# Patient Record
Sex: Male | Born: 1975 | Race: Black or African American | Hispanic: No | Marital: Married | Smoking: Never smoker
Health system: Southern US, Community
[De-identification: ages and names within clinical notes are randomized; demographics above are authoritative.]

## PROBLEM LIST (undated history)

## (undated) DIAGNOSIS — Z973 Presence of spectacles and contact lenses: Secondary | ICD-10-CM

## (undated) DIAGNOSIS — B019 Varicella without complication: Secondary | ICD-10-CM

## (undated) DIAGNOSIS — A879 Viral meningitis, unspecified: Secondary | ICD-10-CM

## (undated) DIAGNOSIS — L309 Dermatitis, unspecified: Secondary | ICD-10-CM

## (undated) DIAGNOSIS — K219 Gastro-esophageal reflux disease without esophagitis: Secondary | ICD-10-CM

## (undated) DIAGNOSIS — T560X1A Toxic effect of lead and its compounds, accidental (unintentional), initial encounter: Secondary | ICD-10-CM

## (undated) DIAGNOSIS — N201 Calculus of ureter: Secondary | ICD-10-CM

## (undated) DIAGNOSIS — J302 Other seasonal allergic rhinitis: Secondary | ICD-10-CM

## (undated) HISTORY — DX: Gastro-esophageal reflux disease without esophagitis: K21.9

## (undated) HISTORY — DX: Dermatitis, unspecified: L30.9

## (undated) HISTORY — DX: Toxic effect of lead and its compounds, accidental (unintentional), initial encounter: T56.0X1A

## (undated) HISTORY — PX: WISDOM TOOTH EXTRACTION: SHX21

## (undated) HISTORY — DX: Viral meningitis, unspecified: A87.9

## (undated) HISTORY — DX: Varicella without complication: B01.9

---
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
## 2001-07-25 ENCOUNTER — Emergency Department (HOSPITAL_COMMUNITY): Admission: EM | Admit: 2001-07-25 | Discharge: 2001-07-25 | Payer: Self-pay | Admitting: Emergency Medicine

## 2003-08-10 ENCOUNTER — Emergency Department (HOSPITAL_COMMUNITY): Admission: EM | Admit: 2003-08-10 | Discharge: 2003-08-10 | Payer: Self-pay | Admitting: Emergency Medicine

## 2003-08-10 ENCOUNTER — Encounter: Payer: Self-pay | Admitting: Emergency Medicine

## 2004-07-01 ENCOUNTER — Emergency Department (HOSPITAL_COMMUNITY): Admission: EM | Admit: 2004-07-01 | Discharge: 2004-07-01 | Payer: Self-pay | Admitting: Emergency Medicine

## 2004-07-14 ENCOUNTER — Emergency Department (HOSPITAL_COMMUNITY): Admission: EM | Admit: 2004-07-14 | Discharge: 2004-07-14 | Payer: Self-pay | Admitting: Family Medicine

## 2011-11-15 HISTORY — PX: ESOPHAGOGASTRODUODENOSCOPY: SHX1529

## 2012-04-15 ENCOUNTER — Ambulatory Visit: Payer: BLUE CROSS/BLUE SHIELD

## 2012-04-15 ENCOUNTER — Ambulatory Visit (INDEPENDENT_AMBULATORY_CARE_PROVIDER_SITE_OTHER): Payer: BLUE CROSS/BLUE SHIELD | Admitting: Emergency Medicine

## 2012-04-15 VITALS — BP 136/72 | HR 58 | Temp 98.3°F | Resp 16 | Ht 71.5 in | Wt 149.0 lb

## 2012-04-15 DIAGNOSIS — M79609 Pain in unspecified limb: Secondary | ICD-10-CM

## 2012-04-15 DIAGNOSIS — M79676 Pain in unspecified toe(s): Secondary | ICD-10-CM

## 2012-04-15 DIAGNOSIS — S90129A Contusion of unspecified lesser toe(s) without damage to nail, initial encounter: Secondary | ICD-10-CM

## 2012-04-15 NOTE — Progress Notes (Signed)
  Subjective:    Patient ID: Willie Farmer, male    DOB: 1975/11/16, 36 y.o.   MRN: [id number]  HPI patient was in good health until yesterday when he struck his right fifth toe on this out of the bathroom wall. He is here with pain and swelling in the toe.    Review of Systems     Objective:   Physical Exam there is tenderness and swelling over the right fifth toe. There is decreased range of motion with movement of the toe to  [facility] reading (PRIMARY) by  Dr.Myishia Kasik there is no definite fracture seen. There may be a crack in the fused middle and distal phalanx of the fifth toe but I don't think there is..        Assessment & Plan:     Assessment is contusion toe. Treatment is buddy taping.

## 2012-04-17 ENCOUNTER — Encounter: Payer: Self-pay | Admitting: *Deleted

## 2012-04-17 ENCOUNTER — Telehealth: Payer: Self-pay

## 2012-04-17 NOTE — Telephone Encounter (Signed)
PT PICKED UP COPY OF NOTE

## 2012-04-17 NOTE — Telephone Encounter (Signed)
Pt wants a note for work- putting him on light duty.  Goes in today at 3 pm.  Has broken toe.  Call [phone]  Pt would like to pick up the note and have it faxed to [phone]

## 2012-05-22 ENCOUNTER — Ambulatory Visit (INDEPENDENT_AMBULATORY_CARE_PROVIDER_SITE_OTHER): Payer: BLUE CROSS/BLUE SHIELD

## 2012-05-23 ENCOUNTER — Ambulatory Visit (INDEPENDENT_AMBULATORY_CARE_PROVIDER_SITE_OTHER): Payer: BLUE CROSS/BLUE SHIELD | Admitting: Family Medicine

## 2012-05-23 VITALS — BP 134/76 | HR 59 | Temp 97.9°F | Resp 18 | Ht 72.0 in | Wt 148.0 lb

## 2012-05-23 DIAGNOSIS — M25519 Pain in unspecified shoulder: Secondary | ICD-10-CM

## 2012-05-23 MED ORDER — METHYLPREDNISOLONE 4 MG PO KIT: PACK | ORAL | Status: AC
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
## 2012-06-12 ENCOUNTER — Telehealth: Payer: Self-pay

## 2012-06-12 DIAGNOSIS — M25519 Pain in unspecified shoulder: Secondary | ICD-10-CM

## 2012-06-12 MED ORDER — MELOXICAM 7.5 MG PO TABS: 7.5000 mg | ORAL_TABLET | Freq: Two times a day (BID) | ORAL | Status: DC

## 2012-06-12 NOTE — Telephone Encounter (Signed)
Pt wanted a another round of the cream that was given to him when he was seen, he states it was not a rx but Dr gave him some samples. Please call pt to let him know if this is possible.
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
## 2012-06-12 NOTE — Telephone Encounter (Signed)
I have no cream on file for him need to know what it is. Have called him and left mssg for call back.

## 2012-07-30 ENCOUNTER — Telehealth: Payer: Self-pay

## 2012-07-30 ENCOUNTER — Ambulatory Visit (INDEPENDENT_AMBULATORY_CARE_PROVIDER_SITE_OTHER): Payer: BLUE CROSS/BLUE SHIELD | Admitting: Family Medicine

## 2012-07-30 ENCOUNTER — Ambulatory Visit: Payer: BLUE CROSS/BLUE SHIELD

## 2012-07-30 VITALS — BP 118/72 | HR 60 | Temp 98.0°F | Resp 16 | Ht 72.0 in | Wt 151.0 lb

## 2012-07-30 DIAGNOSIS — R1013 Epigastric pain: Secondary | ICD-10-CM

## 2012-07-30 DIAGNOSIS — IMO0002 Reserved for concepts with insufficient information to code with codable children: Secondary | ICD-10-CM

## 2012-07-30 DIAGNOSIS — M25519 Pain in unspecified shoulder: Secondary | ICD-10-CM

## 2012-07-30 DIAGNOSIS — K920 Hematemesis: Secondary | ICD-10-CM

## 2012-07-30 LAB — IFOBT (OCCULT BLOOD): IFOBT: NEGATIVE

## 2012-07-30 LAB — POCT CBC
HCT, POC: 44.3 % (ref 43.5–53.7)
MCV: 86.6 fL (ref 80–97)
MPV: 8.3 fL (ref 0–99.8)
POC Granulocyte: 2.5 (ref 2–6.9)
POC LYMPH PERCENT: 33.9 %L (ref 10–50)
POC MID %: 7.9 %M (ref 0–12)
Platelet Count, POC: 243 10*3/uL (ref 142–424)
WBC: 4.3 10*3/uL — AB (ref 4.6–10.2)

## 2012-07-30 MED ORDER — TRAMADOL HCL 50 MG PO TABS: 50.0000 mg | ORAL_TABLET | Freq: Three times a day (TID) | ORAL | Status: DC | PRN

## 2012-07-30 MED ORDER — OMEPRAZOLE 40 MG PO CPDR: 40.0000 mg | DELAYED_RELEASE_CAPSULE | Freq: Every day | ORAL | Status: DC

## 2012-07-30 NOTE — Telephone Encounter (Signed)
Message copied by Chrystie Nose on Mon Jul 30, 2012  2:29 PM ------      Message from: Melvia Heaps D      Created: Mon Jul 30, 2012  2:28 PM      Regarding: New patient       Patient is having black stools and needs an office visit next 24 hours. He is having a minor GI bleed

## 2012-07-30 NOTE — Patient Instructions (Addendum)
I have spoken with [facility] and they will call you within the next day.  It seems that you have an ulcer (stomach erosion) which is bleeding a small amount.    No more mobic or other NSAID medications such as ibuprofen or aleve.  Please take your PPI (acid reducer medication).   If anything changes- if you develop more pain, have rectal bleeding or are throwing up red blood or have more frequent vomiting- please come back or go to the ER right away
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
## 2012-07-30 NOTE — Telephone Encounter (Signed)
Pt scheduled to see Amy Esterwood PA tomorrow at 9am. Pt aware of appt date and time. 

## 2012-07-31 ENCOUNTER — Encounter: Payer: Self-pay | Admitting: Physician Assistant

## 2012-07-31 ENCOUNTER — Ambulatory Visit (INDEPENDENT_AMBULATORY_CARE_PROVIDER_SITE_OTHER): Payer: Self-pay | Admitting: Physician Assistant

## 2012-07-31 VITALS — BP 110/60 | HR 60 | Ht 73.0 in | Wt 153.6 lb

## 2012-07-31 DIAGNOSIS — K920 Hematemesis: Secondary | ICD-10-CM

## 2012-07-31 DIAGNOSIS — R1013 Epigastric pain: Secondary | ICD-10-CM

## 2012-07-31 DIAGNOSIS — L98499 Non-pressure chronic ulcer of skin of other sites with unspecified severity: Secondary | ICD-10-CM

## 2012-07-31 DIAGNOSIS — IMO0002 Reserved for concepts with insufficient information to code with codable children: Secondary | ICD-10-CM

## 2012-07-31 MED ORDER — OMEPRAZOLE 40 MG PO CPDR: 40.0000 mg | DELAYED_RELEASE_CAPSULE | Freq: Every day | ORAL | Status: DC

## 2012-07-31 NOTE — Progress Notes (Signed)
Subjective:    Patient ID: Willie Farmer, male    DOB: 1976/07/22, 36 y.o.   MRN: [id number]  HPI Willie Farmer is a 36 year old male with new to GI today him a referred by urgent care. He was seen there yesterday by Dr. Dallas Schimke with complaints of epigastric pain and 2 episodes of coffee-ground emesis Patient says that his symptoms started on Friday, 07/27/2012 which point he was having fairly constant epigastric discomfort which was keeping him awake at night. Had not had any nausea at that point a tried multiple different over-the-counter medicines all without any relief. He did take several doses of Pepto-Bismol. Patient had been taking of MOBIC 1 or 2 daily over the past month for shoulder pain which was stopped yesterday. He denies any heartburn or indigestion no dysphagia or diet aphasia. He has not had any melena or hematochezia and states that what he vomited up to taste like blood. Stool for occult blood was checked yesterday and was negative. Plain abdominal films were also done and were unremarkable. CBC showed WBC of 4.3 hemoglobin 13.9 and hematocrit of 44.3. He has been started on Prilosec 40 mg once daily and says they are ready feels much better. He still getting tabs of discomfort in his abdomen but the constant pain is subsiding. He has been eating but admits that he is afraid to eat because he feels that eating will either rate his symptoms.  Patient has been generally healthy he is employed as a Psychologist, occupational and has very physical job.  Is currently out of work since yesterday. He does not have any prior GI history. 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 He says he did have some long-term memory loss with this.    Review of Systems  Constitutional: Positive for appetite change.  HENT: Negative.   Eyes: Negative.   Respiratory: Negative.   Cardiovascular: Negative.   Gastrointestinal: Positive for nausea, vomiting  and abdominal pain.  Genitourinary: Negative.   Musculoskeletal: Negative.   Neurological: Negative.   Hematological: Negative.   Psychiatric/Behavioral: Negative.    Outpatient Prescriptions Prior to Visit  Medication Sig Dispense Refill  . traMADol (ULTRAM) 50 MG tablet Take 1 tablet (50 mg total) by mouth every 8 (eight) hours as needed for pain. Be cautious of sedation, use sparingly  30 tablet  0  . omeprazole (PRILOSEC) 40 MG capsule Take 1 capsule (40 mg total) by mouth daily.  30 capsule  3  . meloxicam (MOBIC) 7.5 MG tablet Take 1 tablet (7.5 mg total) by mouth 2 (two) times daily.  60 tablet  5   Allergies  Allergen Reactions  . Nsaids     Intolerance    Active Ambulatory Problems    Diagnosis Date Noted  . No Active Ambulatory Problems   Resolved Ambulatory Problems    Diagnosis Date Noted  . No Resolved Ambulatory Problems   Past Medical History  Diagnosis Date  . Meningitis, viral 1995   History   Social History  . Marital Status: Married    Spouse Name: N/A    Number of Children: N/A  . Years of Education: N/A   Occupational History  . Welder    Social History Main Topics  . Smoking status: Never Smoker   . Smokeless tobacco: Never Used  . Alcohol Use: Yes  . Drug Use: No  . Sexually Active: Not on file   Other Topics Concern  .  Not on file   Social History Narrative  . No narrative on file    Objective:   Physical Exam well-developed thin pleasant African American male in no acute distress, accompanied by his wife. Blood pressure 110/60 pulse 60 height 6 foot 1 weight 153. HEENT; nontraumatic normocephalic EOMI PERRLA sclera anicteric,Neck; Supple no JVD, Cardiovascular; regular rate and rhythm with S1-S2 no murmur or gallop, Pulmonary; clear bilaterally, Abdomen; soft bowel sounds are active he is tender in the epigastrium there is no guarding no rebound no palpable mass or hepatosplenomegaly, Rectal; exam not done fecal occult blood test  yesterday was negative, Extremities; no clubbing, cyanosis, or edema skin warm dry, Psych; mood and affect normal and appropriate        Assessment & Plan:  #12 36 year old male with 4 day history of epigastric pain nausea and self-limited episode of coffee-ground emesis. I suspect he has an NSAID-induced gastropathy or ulcer. He has no evidence for active bleeding, and symptoms are improving  Plan; increase Prilosec to 40 mg by mouth twice daily x2 weeks then once daily thereafter Stop all NSAID use, may use Tylenol as needed for shoulder pain Schedule for upper endoscopy with Dr. Jarold Motto as soon as possible-this has been scheduled for tomorrow a.m. Soft bland diet until epigastric pain subsides Patient is to remain out of work until after the endoscopy then we'll decide on timing of return to work based on findings.

## 2012-07-31 NOTE — Patient Instructions (Addendum)
We scheduled the Endoscopy with Dr. Sheryn Bison. Increase the Prilosec 40 mg to 1 capsule twice daily for 2 weeks then go back to one capsule once daily.  Upper GI Endoscopy Upper GI endoscopy means using a flexible scope to look at the esophagus, stomach, and upper small bowel. This is done to make a diagnosis in people with heartburn, abdominal pain, or abnormal bleeding. Sometimes an endoscope is needed to remove foreign bodies or food that become stuck in the esophagus; it can also be used to take biopsy samples. For the best results, do not eat or drink for 8 hours before having your upper endoscopy.  To perform the endoscopy, you will probably be sedated and your throat will be numbed with a special spray. The endoscope is then slowly passed down your throat (this will not interfere with your breathing). An endoscopy exam takes 15 to 30 minutes to complete and there is no real pain. Patients rarely remember much about the procedure. The results of the test may take several days if a biopsy or other test is taken.  You may have a sore throat after an endoscopy exam. Serious complications are very rare. Stick to liquids and soft foods until your pain is better. Do not drive a car or operate any dangerous equipment for at least 24 hours after being sedated. SEEK IMMEDIATE MEDICAL CARE IF:   You have severe throat pain.   You have shortness of breath.   You have bleeding problems.   You have a fever.   You have difficulty recovering from your sedation.  Document Released: 12/08/2004 Document Revised: 10/20/2011 Document Reviewed: 11/02/2008 [facility] Patient Information 2012 [place], [place].

## 2012-07-31 NOTE — Progress Notes (Signed)
I agree with assessment and plan.

## 2012-08-01 ENCOUNTER — Encounter: Payer: Self-pay | Admitting: Gastroenterology

## 2012-08-01 ENCOUNTER — Ambulatory Visit (AMBULATORY_SURGERY_CENTER): Payer: BLUE CROSS/BLUE SHIELD | Admitting: Gastroenterology

## 2012-08-01 ENCOUNTER — Telehealth: Payer: Self-pay | Admitting: Gastroenterology

## 2012-08-01 ENCOUNTER — Encounter: Payer: Self-pay | Admitting: Family Medicine

## 2012-08-01 VITALS — BP 131/74 | HR 64 | Temp 97.0°F | Resp 18 | Ht 73.0 in | Wt 153.0 lb

## 2012-08-01 DIAGNOSIS — K920 Hematemesis: Secondary | ICD-10-CM

## 2012-08-01 DIAGNOSIS — K2100 Gastro-esophageal reflux disease with esophagitis, without bleeding: Secondary | ICD-10-CM

## 2012-08-01 DIAGNOSIS — Z8719 Personal history of other diseases of the digestive system: Secondary | ICD-10-CM

## 2012-08-01 DIAGNOSIS — R1013 Epigastric pain: Secondary | ICD-10-CM

## 2012-08-01 HISTORY — PX: ESOPHAGOGASTRODUODENOSCOPY: SHX1529

## 2012-08-01 MED ORDER — SODIUM CHLORIDE 0.9 % IV SOLN: 500.0000 mL | INTRAVENOUS | Status: DC

## 2012-08-01 NOTE — Patient Instructions (Addendum)
Per Dr. Jarold Motto avoid NSAIDS for two weeks.  Await biopsy results.  You may resume your other current medications today.  Please call if any questions or concerns.    YOU HAD AN ENDOSCOPIC PROCEDURE TODAY AT THE [facility]: Refer to the procedure report that was given to you for any specific questions about what was found during the examination.  If the procedure report does not answer your questions, please call your gastroenterologist to clarify.  If you requested that your care partner not be given the details of your procedure findings, then the procedure report has been included in a sealed envelope for you to review at your convenience later.  YOU SHOULD EXPECT: Some feelings of bloating in the abdomen. Passage of more gas than usual.  Walking can help get rid of the air that was put into your GI tract during the procedure and reduce the bloating. If you had a lower endoscopy (such as a colonoscopy or flexible sigmoidoscopy) you may notice spotting of blood in your stool or on the toilet paper. If you underwent a bowel prep for your procedure, then you may not have a normal bowel movement for a few days.  DIET: Your first meal following the procedure should be a light meal and then it is ok to progress to your normal diet.  A half-sandwich or bowl of soup is an example of a good first meal.  Heavy or fried foods are harder to digest and may make you feel nauseous or bloated.  Likewise meals heavy in dairy and vegetables can cause extra gas to form and this can also increase the bloating.  Drink plenty of fluids but you should avoid alcoholic beverages for 24 hours.  ACTIVITY: Your care partner should take you home directly after the procedure.  You should plan to take it easy, moving slowly for the rest of the day.  You can resume normal activity the day after the procedure however you should NOT DRIVE or use heavy machinery for 24 hours (because of the sedation medicines used during  the test).    SYMPTOMS TO REPORT IMMEDIATELY: A gastroenterologist can be reached at any hour.  During normal business hours, 8:30 AM to 5:00 PM Monday through Friday, call [phone].  After hours and on weekends, please call the GI answering service at [phone] who will take a message and have the physician on call contact you.     Following upper endoscopy (EGD)  Vomiting of blood or coffee ground material  New chest pain or pain under the shoulder blades  Painful or persistently difficult swallowing  New shortness of breath  Fever of 100F or higher  Black, tarry-looking stools  FOLLOW UP: If any biopsies were taken you will be contacted by phone or by letter within the next 1-3 weeks.  Call your gastroenterologist if you have not heard about the biopsies in 3 weeks.  Our staff will call the home number listed on your records the next business day following your procedure to check on you and address any questions or concerns that you may have at that time regarding the information given to you following your procedure. This is a courtesy call and so if there is no answer at the home number and we have not heard from you through the emergency physician on call, we will assume that you have returned to your regular daily activities without incident.  SIGNATURES/CONFIDENTIALITY: You and/or your care partner have signed paperwork which  will be entered into your electronic medical record.  These signatures attest to the fact that that the information above on your After Visit Summary has been reviewed and is understood.  Full responsibility of the confidentiality of this discharge information lies with you and/or your care-partner.

## 2012-08-01 NOTE — Progress Notes (Signed)
No complaints noted in the recovery room. Maw  Patient did not have preoperative order for IV antibiotic SSI prophylaxis. (G8918) Patient did not experience any of the following events: a burn prior to discharge; a fall within the facility; wrong site/side/patient/procedure/implant event; or a hospital transfer or hospital admission upon discharge from the facility. (G8907)  

## 2012-08-01 NOTE — Op Note (Signed)
[facility] [address] [place], [address]   ENDOSCOPY PROCEDURE REPORT  PATIENT: Willie Farmer, Willie Farmer  MR#: [id number] BIRTHDATE: 1975-12-11 , 35  yrs. old GENDER: Male ENDOSCOPIST:David Hale Bogus, MD, [facility] REFERRED BY: PROCEDURE DATE:  08/01/2012 PROCEDURE:   EGD w/ biopsy and EGD w/ biopsy for H.pylori ASA CLASS:    Class I INDICATIONS: periumbilical abdominal pain, dyspepsia, and hematemesis. MEDICATION: propofol (Diprivan) 250mg  IV TOPICAL ANESTHETIC:   Cetacaine Spray  DESCRIPTION OF PROCEDURE:   After the risks and benefits of the procedure were explained, informed consent was obtained.  The LB [id number]  endoscope was introduced through the mouth  and advanced to the second portion of the duodenum .  The instrument was slowly withdrawn as the mucosa was fully examined.      ANTRAL EROSIONS WITHOUT BLEEDING NOTED...CLO BX.  DONE...SEE PICTURES.  ESOPHAGUS: There was evidence of suspected Barrett's esophagus at the gastroesophageal junction.  Multiple biopsies were performed using cold forceps.  DUODENUM: NORMAL MUCOSA.    Retroflexed views revealed no abnormalities.  The scope was then withdrawn from the patient and the procedure completed.  COMPLICATIONS: There were no complications.   ENDOSCOPIC IMPRESSION: 1.   ANTRAL EROSIONS WITHOUT BLEEDING NOTED...CLO BX.  DONE...SEE PICTURES .Marland KitchenPROBABLE NSAID DAMAGE,NO CURRENT BLEEDING. 2.   There was evidence of suspected Barrett's esophagus; multiple biopsies 3.   NORMAL MUCOSA IN DUODENUM  RECOMMENDATIONS: 1.  Avoid NSAIDS for two weeks 2.  await pathology results 3.  continue PPI  4.  Rx CLO if positive    _______________________________ eSigned:  Mardella Layman, MD, [facility] 08/01/2012 11:27 AM   standard discharge   PATIENT NAME:  [name], [name] MR#: [id number]

## 2012-08-01 NOTE — Telephone Encounter (Signed)
Pt. Wanted to mention that when he eats late at night after his shift that he coughs violently and it feels like is is going to vomit.  He typically eats at [facility]-chopped cheese steak. He has this meal anywhere from 11:30PM- 3PM.  States after he has "dry cough" that he feels better.  He has no problems with after meals other than after  late night to early AM meals.

## 2012-08-02 ENCOUNTER — Telehealth: Payer: Self-pay

## 2012-08-02 ENCOUNTER — Encounter: Payer: Self-pay | Admitting: Gastroenterology

## 2012-08-02 LAB — HELICOBACTER PYLORI SCREEN-BIOPSY: UREASE: NEGATIVE

## 2012-08-02 NOTE — Telephone Encounter (Signed)
  Follow up Call-  Call back number 08/01/2012  Post procedure Call Back phone  # [phone]  Permission to leave phone message Yes     Patient questions:  Do you have a fever, pain , or abdominal swelling? no Pain Score  0 *  Have you tolerated food without any problems? yes  Have you been able to return to your normal activities? yes  Do you have any questions about your discharge instructions: Diet   no Medications  no Follow up visit  no  Do you have questions or concerns about your Care? no  Actions: * If pain score is 4 or above: No action needed, pain <4.

## 2012-08-02 NOTE — Telephone Encounter (Signed)
This is done for him, he had recent stomach ulcer and is beginning to feel better, but has not been able to work, will try on Monday note placed at front desk for him

## 2012-08-02 NOTE — Telephone Encounter (Signed)
Pt is needing another return to work note, he says that all he needs is the date to be changed to 08-06-12 for date to return to work, he would like a call when note is ready for pick-up. Pt can be reached @ [phone]

## 2012-08-06 ENCOUNTER — Telehealth: Payer: Self-pay | Admitting: Gastroenterology

## 2012-08-06 NOTE — Telephone Encounter (Signed)
Informed pt the complete results aren't back; the H.pylori was negative. Pt works 2nd shift and will call back tomorrow.

## 2012-08-09 ENCOUNTER — Telehealth: Payer: Self-pay | Admitting: Gastroenterology

## 2012-08-09 NOTE — Telephone Encounter (Signed)
H. pylori test was negative Lower esophagus/GE junction biopsies are negative for Barrett's, but do show changes consistent with reflux (i.e. Inflammation) He should continue his PPI and followup with Dr. Jarold Motto as reccommended

## 2012-08-09 NOTE — Telephone Encounter (Signed)
Informed pt of his results and he should continue his PPI and f/u with Dr Jarold Motto as recommended. Pt stated understanding and states he feels better since taking omeprazole BID. We discussed him tapering to QD and he will try and if he develops problems he will call.

## 2012-08-09 NOTE — Telephone Encounter (Signed)
Can you please review the path from EGD? Thanks.

## 2012-08-12 ENCOUNTER — Telehealth: Payer: Self-pay

## 2012-08-12 DIAGNOSIS — R1013 Epigastric pain: Secondary | ICD-10-CM

## 2012-08-12 DIAGNOSIS — K920 Hematemesis: Secondary | ICD-10-CM

## 2012-08-12 DIAGNOSIS — IMO0002 Reserved for concepts with insufficient information to code with codable children: Secondary | ICD-10-CM

## 2012-08-12 NOTE — Telephone Encounter (Signed)
rec'd a rx for prilosec(generic) and needs a refill b/c he was told to increase the original dosage.  [facility] [address] [facility] [place]

## 2012-08-13 MED ORDER — OMEPRAZOLE 40 MG PO CPDR: 40.0000 mg | DELAYED_RELEASE_CAPSULE | Freq: Every day | ORAL | Status: DC

## 2012-08-13 NOTE — Telephone Encounter (Signed)
Please advise on refill, patient has increased to BID

## 2012-08-13 NOTE — Telephone Encounter (Signed)
Per GI note he is supposed to be taking one daily and follow up with GI as planned

## 2012-08-13 NOTE — Telephone Encounter (Signed)
I have advised patient of once daily dose and let him know we have sent this to his pharmacy

## 2012-08-22 ENCOUNTER — Encounter: Payer: Self-pay | Admitting: Gastroenterology

## 2012-08-22 ENCOUNTER — Telehealth: Payer: Self-pay

## 2012-08-22 NOTE — Telephone Encounter (Signed)
Pt is requesting a stronger medication for reflux/ulcer - the omeprazole (PRILOSEC) 40 MG capsule Is not strong enough.  Feels like he has a hole in him.  He takes the pill in the morning - goes back to sleep, when he wakes up the old feelings return.   [facility] [place]   Please text a message to patient at [phone]

## 2012-08-23 NOTE — Telephone Encounter (Signed)
He really needs to follow up with the GI doctor, I can not send text messages, I called him back and advised him to call GI doctor, he may need further studies or work up for his abdominal situation. Willie Farmer

## 2012-08-23 NOTE — Telephone Encounter (Signed)
Pt needs to follow up with GI as planned per 08/09/12 telephone encounter

## 2012-08-27 ENCOUNTER — Telehealth: Payer: Self-pay | Admitting: Gastroenterology

## 2012-08-27 MED ORDER — ESOMEPRAZOLE MAGNESIUM 40 MG PO CPDR: 40.0000 mg | DELAYED_RELEASE_CAPSULE | Freq: Every day | ORAL | Status: DC

## 2012-08-27 MED ORDER — OMEPRAZOLE-SODIUM BICARBONATE 40-1100 MG PO CAPS: 1.0000 | ORAL_CAPSULE | Freq: Every day | ORAL | Status: DC

## 2012-08-27 NOTE — Telephone Encounter (Signed)
Pt reports the prilosec isn't working and he would like to try another PPI. He asked for Nexium and it was ordered, but I informed him sometimes, the script required prior auth, so call to see if the script is ready. Pt stated understanding.

## 2012-08-27 NOTE — Telephone Encounter (Signed)
Pt reports the Nexium co pay was $55 and he would like something cheaper like Zegerid. Ordered the Zegerid, but left him a box of 5 Nexium with a coupon for add'l off. He stated understanding.

## 2012-09-09 ENCOUNTER — Ambulatory Visit: Payer: BLUE CROSS/BLUE SHIELD

## 2012-09-09 ENCOUNTER — Ambulatory Visit (INDEPENDENT_AMBULATORY_CARE_PROVIDER_SITE_OTHER): Payer: BLUE CROSS/BLUE SHIELD | Admitting: Internal Medicine

## 2012-09-09 VITALS — BP 111/70 | HR 69 | Temp 98.0°F | Resp 18 | Ht 73.5 in | Wt 151.0 lb

## 2012-09-09 DIAGNOSIS — M25529 Pain in unspecified elbow: Secondary | ICD-10-CM

## 2012-09-09 DIAGNOSIS — M7710 Lateral epicondylitis, unspecified elbow: Secondary | ICD-10-CM

## 2012-09-09 DIAGNOSIS — M25521 Pain in right elbow: Secondary | ICD-10-CM

## 2012-09-09 MED ORDER — METHYLPREDNISOLONE ACETATE 80 MG/ML IJ SUSP
120.0000 mg | Freq: Once | INTRAMUSCULAR | Status: AC
  Administered 2012-09-09: 120 mg via INTRAMUSCULAR

## 2012-09-09 NOTE — Progress Notes (Signed)
  Subjective:    Patient ID: Willie Farmer, male    DOB: May 14, 1976, 37 y.o.   MRN: [id number]  HPI Works as a Psychologist, occupational, has had progressive pain at right elboe lateral. No NMSV loss. Painfull to grip.   Review of Systems     Objective:   Physical Exam Tender at lateral epicondyle Pain wrist extention Full rom/nmsv intact  [facility] reading (PRIMARY) by  Dr.Milon Dethloff normal xr elbow..       Assessment & Plan:  PT taught Ice /massage/ stretch Elbow strap

## 2012-09-09 NOTE — Patient Instructions (Addendum)
Lateral Epicondylitis (Tennis Elbow) with Rehab Lateral epicondylitis involves inflammation and pain around the outer portion of the elbow. The pain is caused by inflammation of the tendons in the forearm that bring back (extend) the wrist. Lateral epicondylittis is also called tennis elbow, because it is very common in tennis players. However, it may occur in any individual who extends the wrist repetitively. If lateral epicondylitis is left untreated, it may become a chronic problem. SYMPTOMS   Pain, tenderness, and inflammation on the outer (lateral) side of the elbow.  Pain or weakness with gripping activities.  Pain that increases with wrist twisting motions (playing tennis, using a screwdriver, opening a door or a jar).  Pain with lifting objects, including a coffee cup. CAUSES  Lateral epicondylitis is caused by inflammation of the tendons that extend the wrist. Causes of injury may include:  Repetitive stress and strain on the muscles and tendons that extend the wrist.  Sudden change in activity level or intensity.  Incorrect grip in racquet sports.  Incorrect grip size of racquet (often too large).  Incorrect hitting position or technique (usually backhand, leading with the elbow).  Using a racket that is too heavy. RISK INCREASES WITH:  Sports or occupations that require repetitive and/or strenuous forearm and wrist movements (tennis, squash, racquetball, carpentry).  Poor wrist and forearm strength and flexibility.  Failure to warm up properly before activity.  Resuming activity before healing, rehabilitation, and conditioning are complete. PREVENTION   Warm up and stretch properly before activity.  Maintain physical fitness:  Strength, flexibility, and endurance.  Cardiovascular fitness.  Wear and use properly fitted equipment.  Learn and use proper technique and have a coach correct improper technique.  Wear a tennis elbow (counterforce) brace. PROGNOSIS   The course of this condition depends on the degree of the injury. If treated properly, acute cases (symptoms lasting less than 4 weeks) are often resolved in 2 to 6 weeks. Chronic (longer lasting cases) often resolve in 3 to 6 months, but may require physical therapy. RELATED COMPLICATIONS   Frequently recurring symptoms, resulting in a chronic problem. Properly treating the problem the first time decreases frequency of recurrence.  Chronic inflammation, scarring tendon degeneration, and partial tendon tear, requiring surgery.  Delayed healing or resolution of symptoms. TREATMENT  Treatment first involves the use of ice and medicine, to reduce pain and inflammation. Strengthening and stretching exercises may help reduce discomfort, if performed regularly. These exercises may be performed at home, if the condition is an acute injury. Chronic cases may require a referral to a physical therapist for evaluation and treatment. Your caregiver may advise a corticosteroid injection, to help reduce inflammation. Rarely, surgery is needed. MEDICATION  If pain medicine is needed, nonsteroidal anti-inflammatory medicines (aspirin and ibuprofen), or other minor pain relievers (acetaminophen), are often advised.  Do not take pain medicine for 7 days before surgery.  Prescription pain relievers may be given, if your caregiver thinks they are needed. Use only as directed and only as much as you need.  Corticosteroid injections may be recommended. These injections should be reserved only for the most severe cases, because they can only be given a certain number of times. HEAT AND COLD  Cold treatment (icing) should be applied for 10 to 15 minutes every 2 to 3 hours for inflammation and pain, and immediately after activity that aggravates your symptoms. Use ice packs or an ice massage.  Heat treatment may be used before performing stretching and strengthening activities prescribed by your   caregiver, physical  therapist, or athletic trainer. Use a heat pack or a warm water soak. SEEK MEDICAL CARE IF: Symptoms get worse or do not improve in 2 weeks, despite treatment. EXERCISES  RANGE OF MOTION (ROM) AND STRETCHING EXERCISES - Epicondylitis, Lateral (Tennis Elbow) These exercises may help you when beginning to rehabilitate your injury. Your symptoms may go away with or without further involvement from your physician, physical therapist or athletic trainer. While completing these exercises, remember:   Restoring tissue flexibility helps normal motion to return to the joints. This allows healthier, less painful movement and activity.  An effective stretch should be held for at least 30 seconds.  A stretch should never be painful. You should only feel a gentle lengthening or release in the stretched tissue. RANGE OF MOTION  Wrist Flexion, Active-Assisted  Extend your right / left elbow with your fingers pointing down.*  Gently pull the back of your hand towards you, until you feel a gentle stretch on the top of your forearm.  Hold this position for __________ seconds. Repeat __________ times. Complete this exercise __________ times per day.  *If directed by your physician, physical therapist or athletic trainer, complete this stretch with your elbow bent, rather than extended. RANGE OF MOTION  Wrist Extension, Active-Assisted  Extend your right / left elbow and turn your palm upwards.*  Gently pull your palm and fingertips back, so your wrist extends and your fingers point more toward the ground.  You should feel a gentle stretch on the inside of your forearm.  Hold this position for __________ seconds. Repeat __________ times. Complete this exercise __________ times per day. *If directed by your physician, physical therapist or athletic trainer, complete this stretch with your elbow bent, rather than extended. STRETCH - Wrist Flexion  Place the back of your right / left hand on a tabletop,  leaving your elbow slightly bent. Your fingers should point away from your body.  Gently press the back of your hand down onto the table by straightening your elbow. You should feel a stretch on the top of your forearm.  Hold this position for __________ seconds. Repeat __________ times. Complete this stretch __________ times per day.  STRETCH  Wrist Extension   Place your right / left fingertips on a tabletop, leaving your elbow slightly bent. Your fingers should point backwards.  Gently press your fingers and palm down onto the table by straightening your elbow. You should feel a stretch on the inside of your forearm.  Hold this position for __________ seconds. Repeat __________ times. Complete this stretch __________ times per day.  STRENGTHENING EXERCISES - Epicondylitis, Lateral (Tennis Elbow) These exercises may help you when beginning to rehabilitate your injury. They may resolve your symptoms with or without further involvement from your physician, physical therapist or athletic trainer. While completing these exercises, remember:   Muscles can gain both the endurance and the strength needed for everyday activities through controlled exercises.  Complete these exercises as instructed by your physician, physical therapist or athletic trainer. Increase the resistance and repetitions only as guided.  You may experience muscle soreness or fatigue, but the pain or discomfort you are trying to eliminate should never worsen during these exercises. If this pain does get worse, stop and make sure you are following the directions exactly. If the pain is still present after adjustments, discontinue the exercise until you can discuss the trouble with your caregiver. STRENGTH Wrist Flexors  Sit with your right / left forearm palm-up and   fully supported on a table or countertop. Your elbow should be resting below the height of your shoulder. Allow your wrist to extend over the edge of the  surface.  Loosely holding a __________ weight, or a piece of rubber exercise band or tubing, slowly curl your hand up toward your forearm.  Hold this position for __________ seconds. Slowly lower the wrist back to the starting position in a controlled manner. Repeat __________ times. Complete this exercise __________ times per day.  STRENGTH  Wrist Extensors  Sit with your right / left forearm palm-down and fully supported on a table or countertop. Your elbow should be resting below the height of your shoulder. Allow your wrist to extend over the edge of the surface.  Loosely holding a __________ weight, or a piece of rubber exercise band or tubing, slowly curl your hand up toward your forearm.  Hold this position for __________ seconds. Slowly lower the wrist back to the starting position in a controlled manner. Repeat __________ times. Complete this exercise __________ times per day.  STRENGTH - Ulnar Deviators  Stand with a ____________________ weight in your right / left hand, or sit while holding a rubber exercise band or tubing, with your healthy arm supported on a table or countertop.  Move your wrist, so that your pinkie travels toward your forearm and your thumb moves away from your forearm.  Hold this position for __________ seconds and then slowly lower the wrist back to the starting position. Repeat __________ times. Complete this exercise __________ times per day STRENGTH - Radial Deviators  Stand with a ____________________ weight in your right / left hand, or sit while holding a rubber exercise band or tubing, with your injured arm supported on a table or countertop.  Raise your hand upward in front of you or pull up on the rubber tubing.  Hold this position for __________ seconds and then slowly lower the wrist back to the starting position. Repeat __________ times. Complete this exercise __________ times per day. STRENGTH  Forearm Supinators   Sit with your right /  left forearm supported on a table, keeping your elbow below shoulder height. Rest your hand over the edge, palm down.  Gently grip a hammer or a soup ladle.  Without moving your elbow, slowly turn your palm and hand upward to a "thumbs-up" position.  Hold this position for __________ seconds. Slowly return to the starting position. Repeat __________ times. Complete this exercise __________ times per day.  STRENGTH  Forearm Pronators   Sit with your right / left forearm supported on a table, keeping your elbow below shoulder height. Rest your hand over the edge, palm up.  Gently grip a hammer or a soup ladle.  Without moving your elbow, slowly turn your palm and hand upward to a "thumbs-up" position.  Hold this position for __________ seconds. Slowly return to the starting position. Repeat __________ times. Complete this exercise __________ times per day.  STRENGTH - Grip  Grasp a tennis ball, a dense sponge, or a large, rolled sock in your hand.  Squeeze as hard as you can, without increasing any pain.  Hold this position for __________ seconds. Release your grip slowly. Repeat __________ times. Complete this exercise __________ times per day.  STRENGTH - Elbow Extensors, Isometric  Stand or sit upright, on a firm surface. Place your right / left arm so that your palm faces your stomach, and it is at the height of your waist.  Place your opposite hand on   the underside of your forearm. Gently push up as your right / left arm resists. Push as hard as you can with both arms, without causing any pain or movement at your right / left elbow. Hold this stationary position for __________ seconds. Gradually release the tension in both arms. Allow your muscles to relax completely before repeating. Document Released: 10/31/2005 Document Revised: 01/23/2012 Document Reviewed: 02/12/2009 [employer] Patient Information 2013 [employer].  

## 2012-09-19 ENCOUNTER — Telehealth: Payer: Self-pay | Admitting: Gastroenterology

## 2012-09-19 MED ORDER — ESOMEPRAZOLE MAGNESIUM 40 MG PO CPDR: 40.0000 mg | DELAYED_RELEASE_CAPSULE | Freq: Two times a day (BID) | ORAL | Status: DC

## 2012-09-19 NOTE — Telephone Encounter (Signed)
yes

## 2012-09-19 NOTE — Telephone Encounter (Signed)
Informed pt I ordered the Nexium for BID; pt stated understanding.

## 2012-09-19 NOTE — Telephone Encounter (Signed)
Pt has tried various PPIs from omeprazole to Zegerid , but Nexium works best. He reports his acid reflux is getting worse and wants to know if he can increase the Nexium to BID?  Please advise. Thanks.

## 2012-10-29 ENCOUNTER — Telehealth: Payer: Self-pay | Admitting: Gastroenterology

## 2012-10-29 NOTE — Telephone Encounter (Signed)
Patient would like to speak with Dr. Norval Gable nurse.  Patient states that he wants to know if he can go back to Nexium once daily because it was cheaper or try Zegerid. Patient is unsure what he should do.

## 2012-10-29 NOTE — Telephone Encounter (Signed)
Pt reports he went to pick up his Nexium and the cost was $173; he wanted to know what he ordered. Explained to pt er ordered 60 capsules for BID use. Pt reports he's better, and would like to decrease to once daily Nexium. Advised pt to wean off the Bid and keep [place] informed. Pt will call for problems.

## 2013-01-09 IMAGING — CR DG ELBOW COMPLETE 3+V*R*
3 series · 3 of 3 positions shown · non-contrast
Comparison: None.

CLINICAL DATA: Pain without trauma

RIGHT ELBOW - COMPLETE 3+ VIEW

[lateral (1 of 2)]
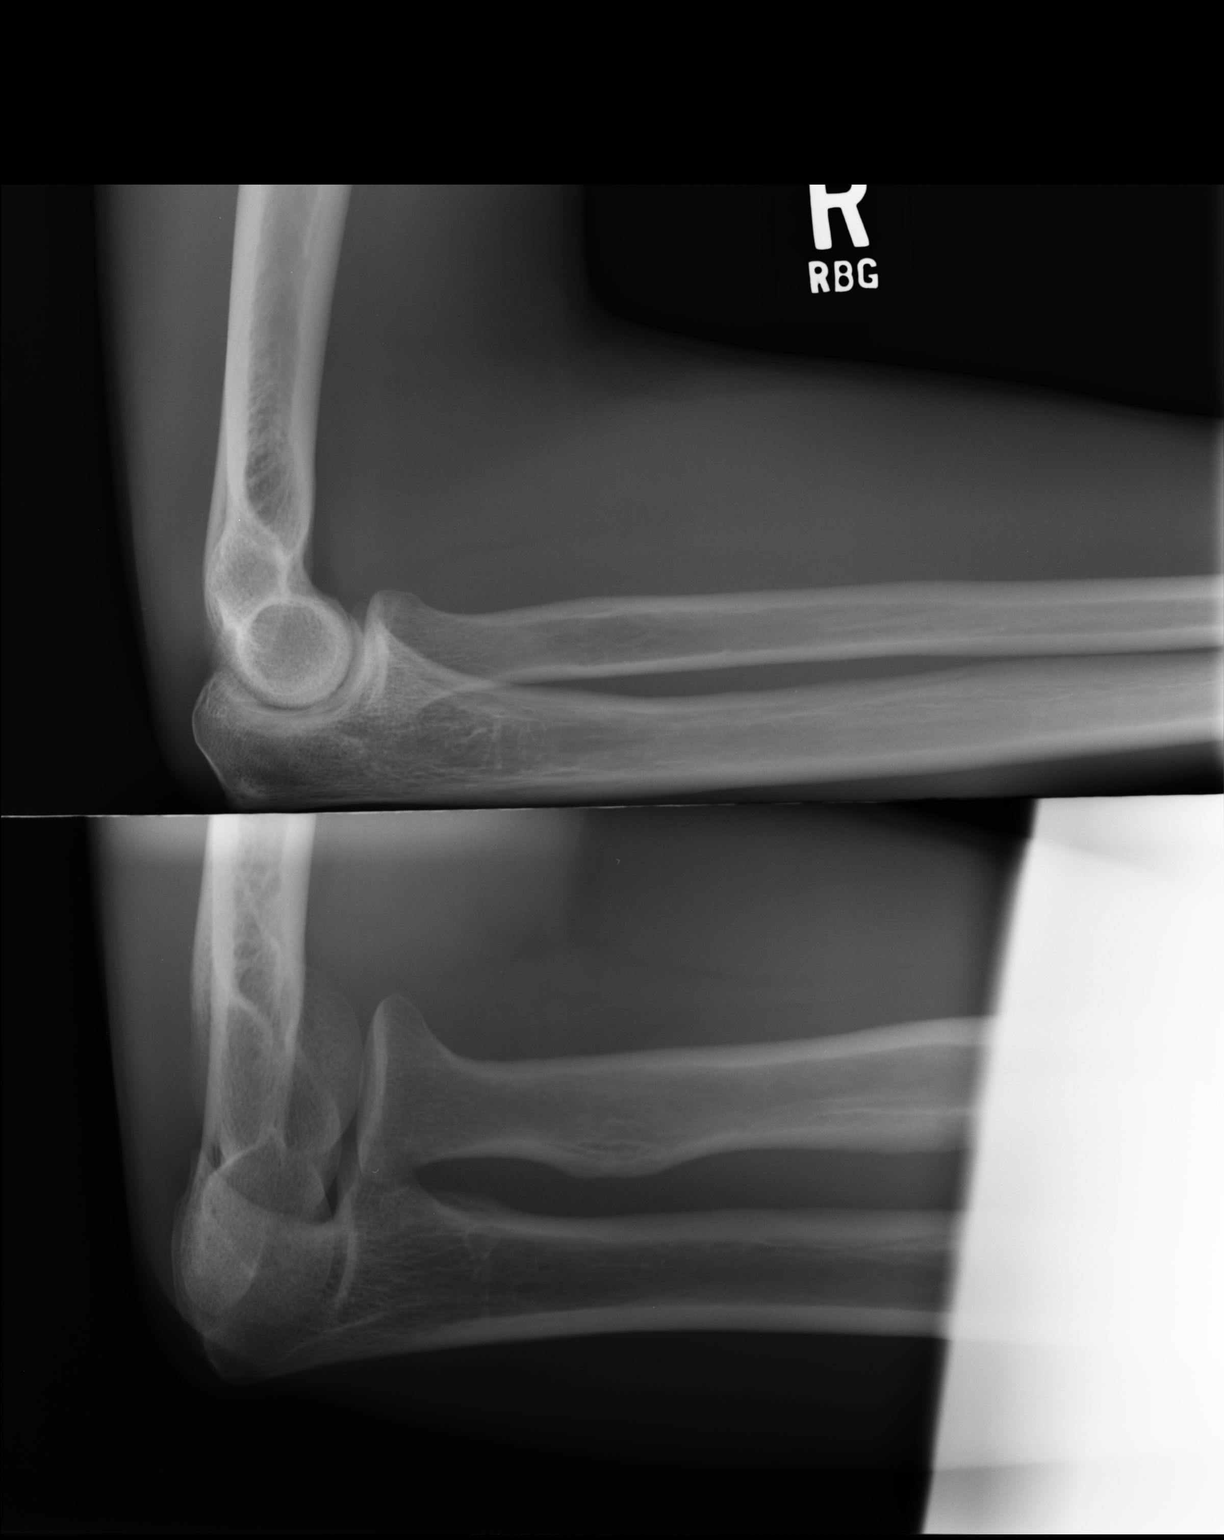

[ap obl int rot]
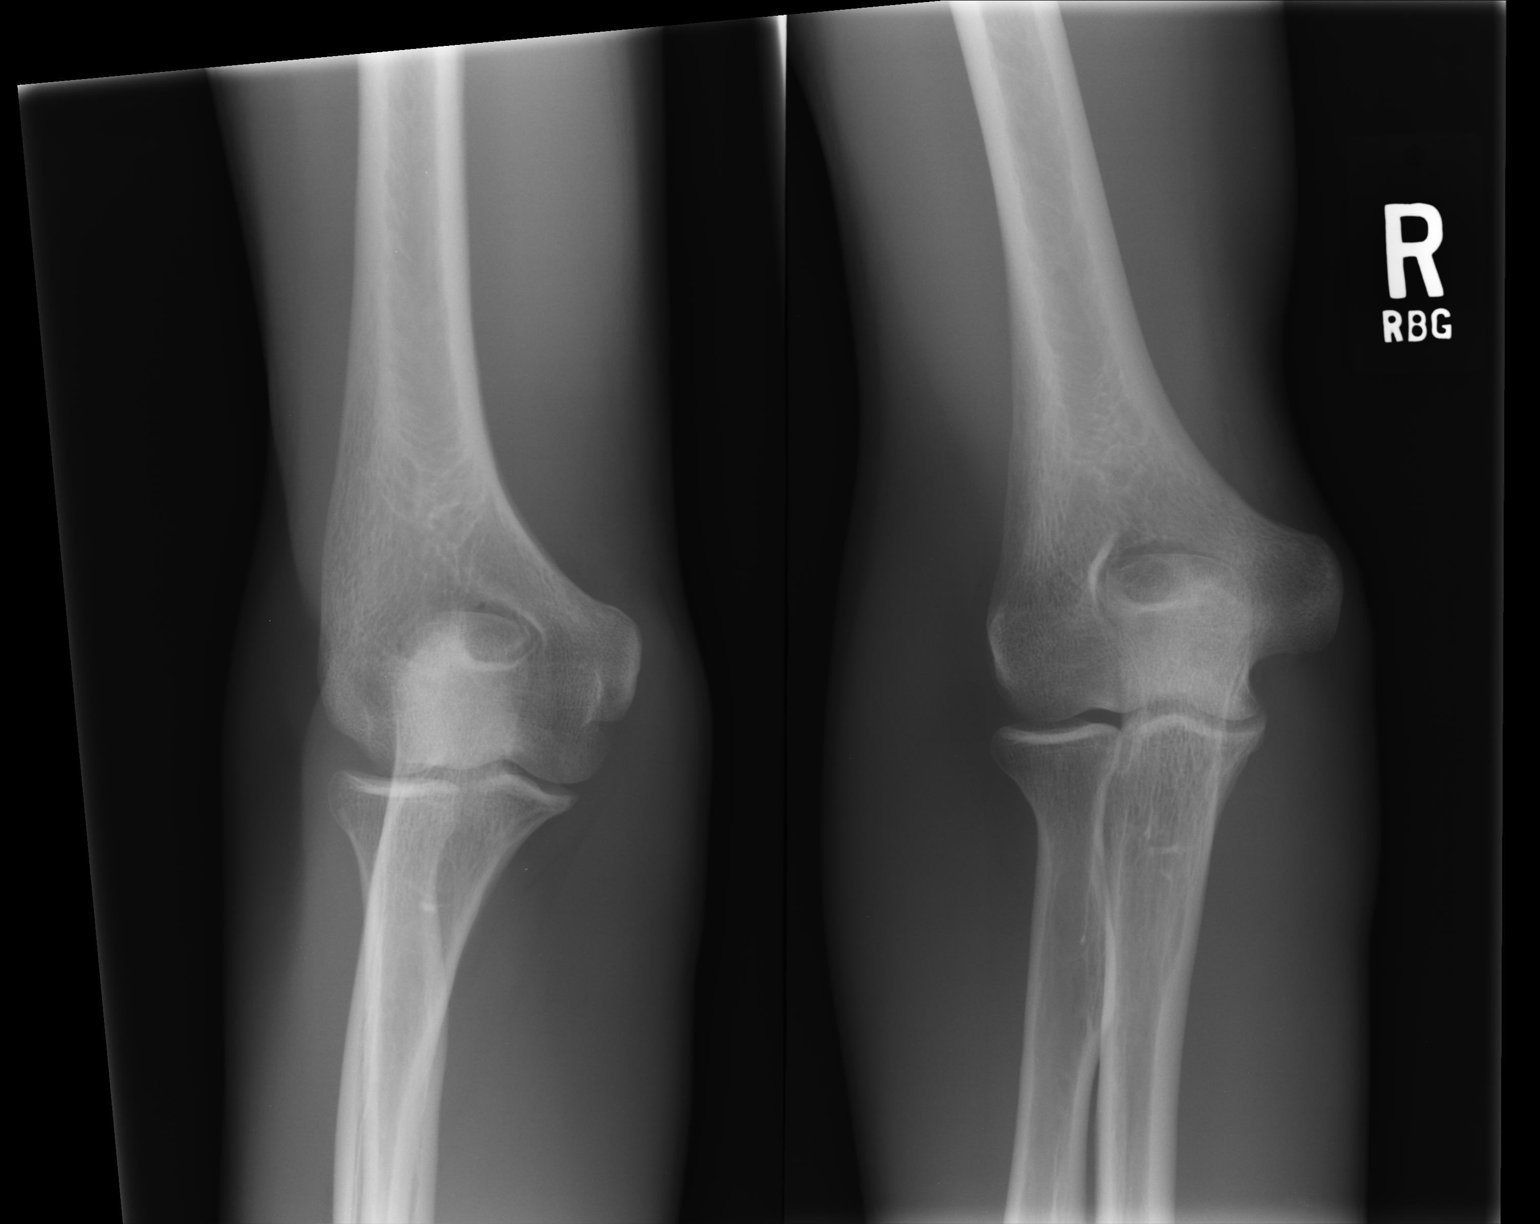

[lateral (2 of 2)]
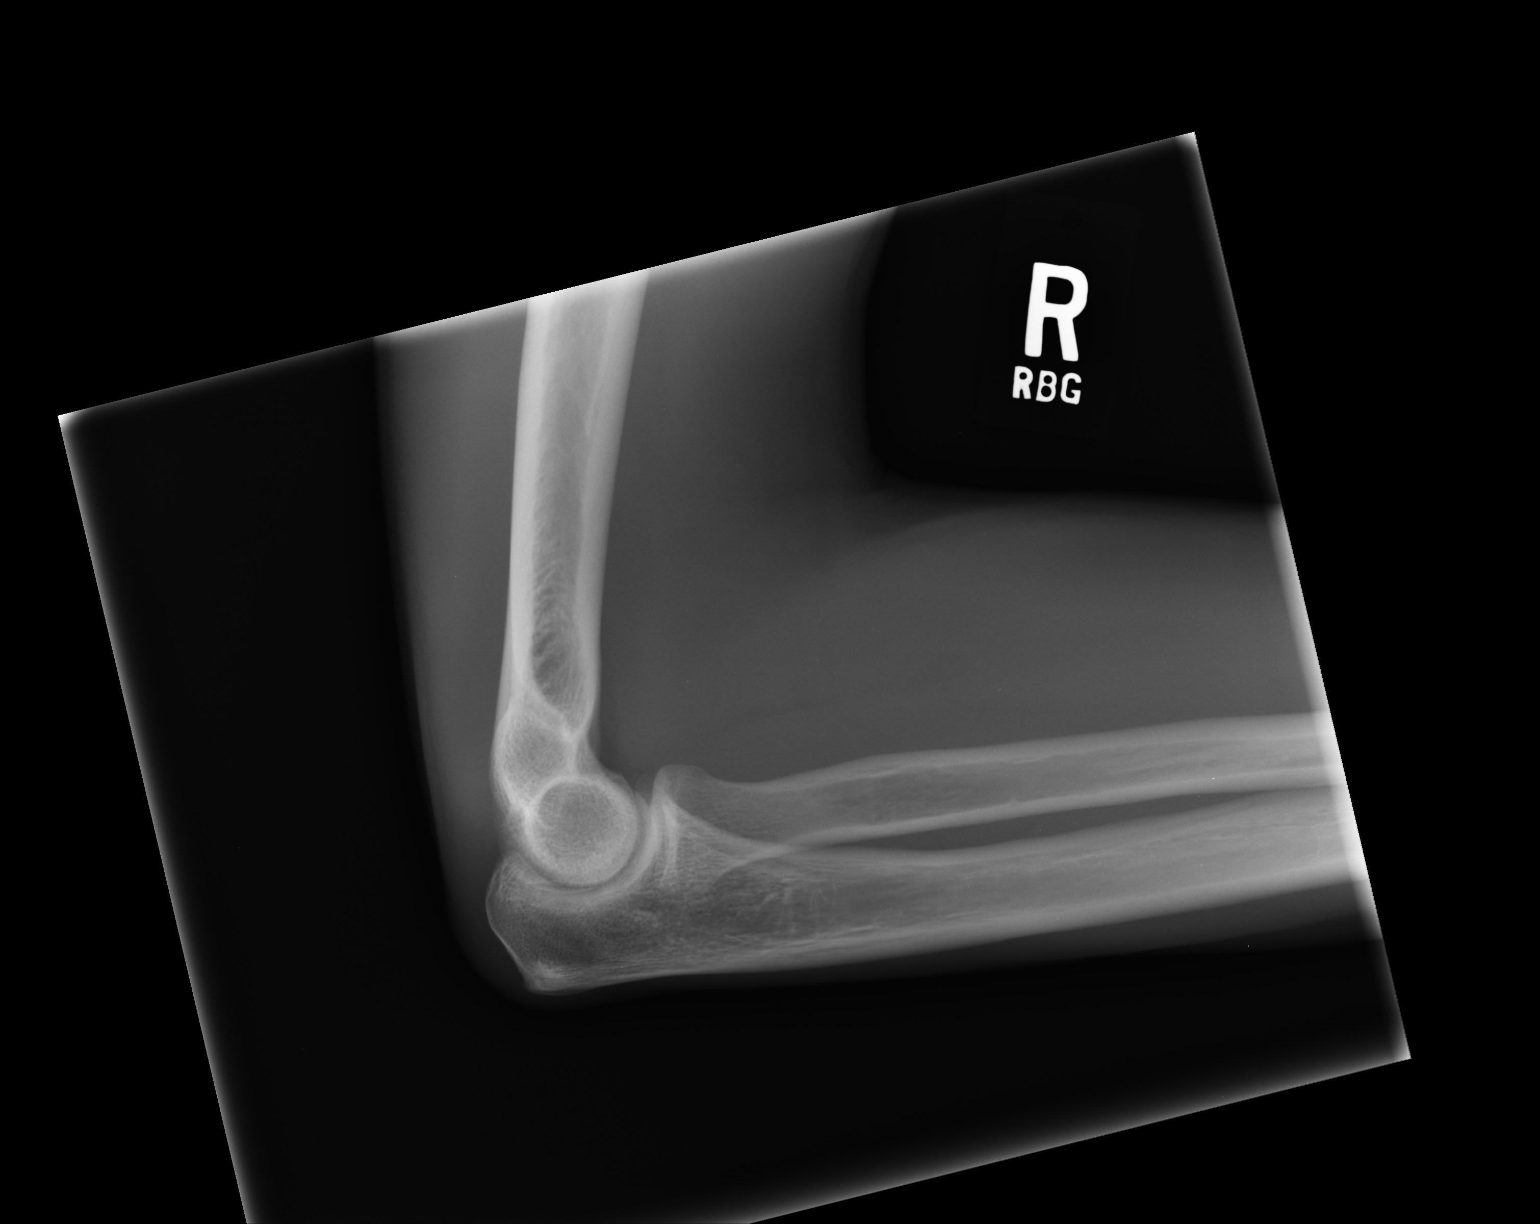

[3 of 3 positions shown; findings below may reference images not displayed]

FINDINGS: No effusion. Negative for fracture, dislocation, or other
acute abnormality.  Normal alignment and mineralization. No
significant degenerative change.  Regional soft tissues
unremarkable.
IMPRESSION: Negative

## 2013-01-11 NOTE — Telephone Encounter (Signed)
error 

## 2013-04-10 ENCOUNTER — Ambulatory Visit (INDEPENDENT_AMBULATORY_CARE_PROVIDER_SITE_OTHER): Payer: BLUE CROSS/BLUE SHIELD | Admitting: Family Medicine

## 2013-04-10 VITALS — BP 114/80 | HR 64 | Temp 97.9°F | Resp 16 | Ht 71.5 in | Wt 149.2 lb

## 2013-04-10 DIAGNOSIS — D649 Anemia, unspecified: Secondary | ICD-10-CM

## 2013-04-10 DIAGNOSIS — R599 Enlarged lymph nodes, unspecified: Secondary | ICD-10-CM

## 2013-04-10 LAB — POCT CBC
Granulocyte percent: 57.9 % (ref 37–80)
HCT, POC: 41.4 % — AB (ref 43.5–53.7)
Hemoglobin: 12.9 g/dL — AB (ref 14.1–18.1)
Lymph, poc: 1.8 (ref 0.6–3.4)
MCH, POC: 27.3 pg (ref 27–31.2)
MCHC: 31.2 g/dL — AB (ref 31.8–35.4)
MCV: 87.7 fL (ref 80–97)
MID (cbc): 0.4 (ref 0–0.9)
MPV: 8 fL (ref 0–99.8)
POC Granulocyte: 3 (ref 2–6.9)
POC LYMPH PERCENT: 34.6 % (ref 10–50)
POC MID %: 7.5 % (ref 0–12)
Platelet Count, POC: 211 10*3/uL (ref 142–424)
RBC: 4.72 M/uL (ref 4.69–6.13)
RDW, POC: 15.4 %
WBC: 5.2 10*3/uL (ref 4.6–10.2)

## 2013-04-10 MED ORDER — AMOXICILLIN 500 MG PO CAPS: 500.0000 mg | ORAL_CAPSULE | Freq: Two times a day (BID) | ORAL | Status: DC

## 2013-04-10 MED ORDER — AMOXICILLIN 500 MG PO CAPS: 500.0000 mg | ORAL_CAPSULE | Freq: Three times a day (TID) | ORAL | Status: DC

## 2013-04-10 NOTE — Progress Notes (Signed)
[facility]:  Office Visit  Chief Complaint:  Chief Complaint  Patient presents with  . Jaw Pain    has a knot on Rt side of jaw  x 2 days    HPI: Willie Farmer is a 37 y.o. male who complains of  Right sided swollen gland x 2-3 days. He has had no recent URI sxs, no fevers, chills. He states he opened his jaw and felt a pop and now there is pain on the side of his neck. He has been able to open his mouth without difficulty since. He just noticed that the "knot" has gotten biger and is more painful. No prior sxs similar to this. Denies immunocompromise,he is married, Denies HIV. Had gonorrhea in the past years ago in college. Deneis HA, neck stiffness, rashes, swollen glans in armpit or groin, weightloss, night sweats.   Past Medical History  Diagnosis Date  . Meningitis, viral 1995  . Acid reflux    Past Surgical History  Procedure Laterality Date  . Wisdom tooth extraction     History   Social History  . Marital Status: Married    Spouse Name: N/A    Number of Children: N/A  . Years of Education: N/A   Occupational History  . Welder    Social History Main Topics  . Smoking status: Never Smoker   . Smokeless tobacco: Never Used  . Alcohol Use: 0.6 oz/week    1 Cans of beer per week     Comment: stopped drinking daily, may drink once every 3 weeks  . Drug Use: No  . Sexually Active: Yes   Other Topics Concern  . None   Social History Narrative  . None   Family History  Problem Relation Age of Onset  . Prostate cancer Father   . Prostate cancer Paternal Grandfather   . Prostate cancer Paternal Uncle    Allergies  Allergen Reactions  . Nsaids     Intolerance   Prior to Admission medications   Medication Sig Start Date End Date Taking? Authorizing Provider  esomeprazole (NEXIUM) 40 MG capsule Take 1 capsule (40 mg total) by mouth 2 (two) times daily. 09/19/12   Mardella Layman, MD     ROS: The patient denies fevers, chills, night  sweats, unintentional weight loss, chest pain, palpitations, wheezing, dyspnea on exertion, nausea, vomiting, abdominal pain, dysuria, hematuria, melena, numbness, weakness, or tingling.   All other systems have been reviewed and were otherwise negative with the exception of those mentioned in the HPI and as above.    PHYSICAL EXAM: Filed Vitals:   04/10/13 2036  BP: 114/80  Pulse: 64  Temp: 97.9 F (36.6 C)  Resp: 16   Filed Vitals:   04/10/13 2036  Height: 5' 11.5" (1.816 m)  Weight: 149 lb 3.2 oz (67.677 kg)   Body mass index is 20.52 kg/(m^2).  General: Alert, no acute distress HEENT:  Normocephalic, atraumatic, oropharynx patent. NO TMJ dislocation/sublux or tenderness. Full ROM neck exam. No exudates. Tm normal. EOMI, PERRLA Cardiovascular:  Regular rate and rhythm, no rubs murmurs or gallops.  No Carotid bruits, radial pulse intact. No pedal edema.  Respiratory: Clear to auscultation bilaterally.  No wheezes, rales, or rhonchi.  No cyanosis, no use of accessory musculature GI: No organomegaly, abdomen is soft and non-tender, positive bowel sounds.  No masses. Skin: No rashes. Neurologic: Facial musculature symmetric. Psychiatric: Patient is appropriate throughout our interaction. Lymphatic: + right 7-9 mm  Preauricular lymphadenopathy  adjacent to mandible. No LAD in axilla or groin.  Musculoskeletal: Gait intact.   LABS: Results for orders placed in visit on 04/10/13  POCT CBC      Result Value Range   WBC 5.2  4.6 - 10.2 K/uL   Lymph, poc 1.8  0.6 - 3.4   POC LYMPH PERCENT 34.6  10 - 50 %L   MID (cbc) 0.4  0 - 0.9   POC MID % 7.5  0 - 12 %M   POC Granulocyte 3.0  2 - 6.9   Granulocyte percent 57.9  37 - 80 %G   RBC 4.72  4.69 - 6.13 M/uL   Hemoglobin 12.9 (*) 14.1 - 18.1 g/dL   HCT, POC 16.1 (*) 09.6 - 53.7 %   MCV 87.7  80 - 97 fL   MCH, POC 27.3  27 - 31.2 pg   MCHC 31.2 (*) 31.8 - 35.4 g/dL   RDW, POC 04.5     Platelet Count, POC 211  142 - 424 K/uL   MPV  8.0  0 - 99.8 fL     EKG/XRAY:   Primary read interpreted by Dr. Conley Rolls at [facility].   ASSESSMENT/PLAN: Encounter Diagnoses  Name Primary?  . Glands swollen Yes  . Anemia    ? Etiology LAD vs cyst Will try a trial of amoxacillin, if no improvement then will consider imaging studies Declined rectal exam for anemia Will get iron studies Gross sideeffects, risk and benefits, and alternatives of medications d/w patient. Patient is aware that all medications have potential sideeffects and we are unable to predict every sideeffect or drug-drug interaction that may occur. F/u in 10 days for f/u on swollen gland, anemia    LE, THAO PHUONG, DO 04/11/2013 8:00 AM

## 2013-04-11 ENCOUNTER — Telehealth: Payer: Self-pay

## 2013-04-11 LAB — IBC PANEL
%SAT: 28 % (ref 20–55)
TIBC: 265 ug/dL (ref 215–435)
UIBC: 191 ug/dL (ref 125–400)

## 2013-04-11 LAB — IRON: Iron: 74 ug/dL (ref 42–165)

## 2013-04-11 LAB — FERRITIN: Ferritin: 182 ng/mL (ref 22–322)

## 2013-04-11 NOTE — Telephone Encounter (Signed)
LM with patient. Try amox for 48-72 hours see if swelling goes down, if worsening sxs return sooner to get additional blood work and imaging study. Advise to call me in the Am when I am in office.

## 2013-04-11 NOTE — Telephone Encounter (Signed)
Pt states he was told to call if he had swelling in his glands,he woke up this am with swelling,he also wants dr Nedra Hai to know that when he sits down he has pain in his thighs???   Best phone [phone]

## 2013-04-11 NOTE — Telephone Encounter (Signed)
Pt states ok to leave message on machine for him.

## 2013-04-13 ENCOUNTER — Ambulatory Visit (INDEPENDENT_AMBULATORY_CARE_PROVIDER_SITE_OTHER): Payer: BLUE CROSS/BLUE SHIELD | Admitting: Family Medicine

## 2013-04-13 VITALS — BP 119/75 | HR 66 | Temp 98.2°F | Resp 18 | Wt 152.0 lb

## 2013-04-13 DIAGNOSIS — R5383 Other fatigue: Secondary | ICD-10-CM

## 2013-04-13 DIAGNOSIS — R413 Other amnesia: Secondary | ICD-10-CM

## 2013-04-13 DIAGNOSIS — R5381 Other malaise: Secondary | ICD-10-CM

## 2013-04-13 DIAGNOSIS — D649 Anemia, unspecified: Secondary | ICD-10-CM

## 2013-04-13 DIAGNOSIS — M25569 Pain in unspecified knee: Secondary | ICD-10-CM

## 2013-04-13 DIAGNOSIS — R599 Enlarged lymph nodes, unspecified: Secondary | ICD-10-CM

## 2013-04-13 LAB — COMPREHENSIVE METABOLIC PANEL WITH GFR
ALT: 18 U/L (ref 0–53)
AST: 25 U/L (ref 0–37)
Albumin: 5.1 g/dL (ref 3.5–5.2)
Alkaline Phosphatase: 42 U/L (ref 39–117)
BUN: 12 mg/dL (ref 6–23)
Calcium: 9.8 mg/dL (ref 8.4–10.5)
Chloride: 105 meq/L (ref 96–112)
Potassium: 4.2 meq/L (ref 3.5–5.3)
Sodium: 140 meq/L (ref 135–145)

## 2013-04-13 LAB — COMPREHENSIVE METABOLIC PANEL
CO2: 28 mEq/L (ref 19–32)
Creat: 1.07 mg/dL (ref 0.50–1.35)
Glucose, Bld: 93 mg/dL (ref 70–99)
Total Bilirubin: 0.3 mg/dL (ref 0.3–1.2)
Total Protein: 7.7 g/dL (ref 6.0–8.3)

## 2013-04-13 LAB — POCT CBC
Granulocyte percent: 57.5 % (ref 37–80)
HCT, POC: 45.8 % (ref 43.5–53.7)
Hemoglobin: 14.1 g/dL (ref 14.1–18.1)
Lymph, poc: 1.4 (ref 0.6–3.4)
MCH, POC: 27.3 pg (ref 27–31.2)
MCHC: 30.8 g/dL — AB (ref 31.8–35.4)
MCV: 88.5 fL (ref 80–97)
MID (cbc): 0.3 (ref 0–0.9)
MPV: 8.2 fL (ref 0–99.8)
POC Granulocyte: 2.3 (ref 2–6.9)
POC LYMPH PERCENT: 35.8 % (ref 10–50)
POC MID %: 6.7 % (ref 0–12)
Platelet Count, POC: 212 10*3/uL (ref 142–424)
RBC: 5.17 M/uL (ref 4.69–6.13)
RDW, POC: 15.6 %
WBC: 4 10*3/uL — AB (ref 4.6–10.2)

## 2013-04-13 LAB — IFOBT (OCCULT BLOOD): IFOBT: NEGATIVE

## 2013-04-13 LAB — HIV ANTIBODY (ROUTINE TESTING W REFLEX): HIV: NONREACTIVE

## 2013-04-13 LAB — TSH: TSH: 0.605 u[IU]/mL (ref 0.350–4.500)

## 2013-04-13 NOTE — Progress Notes (Signed)
[facility]:  Office Visit  Chief Complaint:  Chief Complaint  Patient presents with  . Anemia    recheck  . Memory Loss  . Leg Pain    HPI: Willie Farmer is a 37 y.o. male who complains of: 1. Swollen gland-no longer hurts and feels like it has gotten smaller. + chills, no fevers. Coughing, clear drainage, + nausea since taking amoxacillin.  2. Anemia recheck. Iron studies. Denies family h/o UC, Crohn's Disease, IBD, Diverticular. Deneis bleeding in urine. Occ BRBPR, in the stool. + hemorrhoids, constipation. EGD done for coffe ground emesis. EGD found gastritis/GERD was on Nexium and then stopped taking it in Dec/Nov.  3. Last night he could not sleep, he had tingling and painful and could not go to sleep.  Denies cramps. He has had tingling in his legs and now tingling has escalated. Has had tingling in legs for years  But has just gotten  Worse in last 1 week. Does not matter if he is walking or at rest 4. Has sleep issues. He has difficulty falling asleep because " he has unknown fear, afraid he may get abducted by aliens". When he finally falls asleep he wakes up every 2 hrs but is able to get a total of 5-6 hours. There is no family history of psychiatric disorders. He  Switched from 3-11 pm to 7--3 pm about 2 weeks ago. Starts going to bed at 10-11 pm, wakes up at 1-2:30 in Am and then wakes up at 5:30 am. He is in a quiet room, cool, dark room. Minimum of 6-8 hrs to feel rested. He has tried melatonin and has worked for him in the past.  4. 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 Would like lead level checked   Past Medical History  Diagnosis Date  . Meningitis, viral 1995  . Acid reflux    Past Surgical History  Procedure Laterality Date  . Wisdom tooth extraction     History   Social History  . Marital Status: Married    Spouse Name: N/A    Number of Children: N/A  . Years of Education: N/A    Occupational History  . Welder    Social History Main Topics  . Smoking status: Never Smoker   . Smokeless tobacco: Never Used  . Alcohol Use: 0.6 oz/week    1 Cans of beer per week     Comment: stopped drinking daily, may drink once every 3 weeks  . Drug Use: No  . Sexually Active: Yes   Other Topics Concern  . None   Social History Narrative  . None   Family History  Problem Relation Age of Onset  . Prostate cancer Father   . Prostate cancer Paternal Grandfather   . Prostate cancer Paternal Uncle    Allergies  Allergen Reactions  . Nsaids     Intolerance   Prior to Admission medications   Medication Sig Start Date End Date Taking? Authorizing Provider  amoxicillin (AMOXIL) 500 MG capsule Take 1 capsule (500 mg total) by mouth 2 (two) times daily. 04/10/13  Yes Thao P Le, DO     ROS: The patient denies fevers, chills, night sweats, unintentional weight loss, chest pain, palpitations, wheezing, dyspnea on exertion, nausea, vomiting, abdominal pain, dysuria, hematuria, melena, numbness, weakness, or tingling.   All other systems have been reviewed and were otherwise negative with the exception  of those mentioned in the HPI and as above.    PHYSICAL EXAM: Filed Vitals:   04/13/13 0849  BP: 119/75  Pulse: 66  Temp: 98.2 F (36.8 C)  Resp: 18   Filed Vitals:   04/13/13 0849  Weight: 152 lb (68.947 kg)   Body mass index is 20.91 kg/(m^2).  General: Alert, no acute distress HEENT:  Normocephalic, atraumatic, oropharynx patent.  Cardiovascular:  Regular rate and rhythm, no rubs murmurs or gallops.  No Carotid bruits, radial pulse intact. No pedal edema.  Respiratory: Clear to auscultation bilaterally.  No wheezes, rales, or rhonchi.  No cyanosis, no use of accessory musculature GI: No organomegaly, abdomen is soft and non-tender, positive bowel sounds.  No masses. Skin: No rashes. Neurologic: Facial musculature symmetric.CN 2-12 grossly intact Psychiatric:  Patient is appropriate throughout our interaction. Lymphatic: No cervical lymphadenopathy Musculoskeletal: Gait intact. 5/5 strength.    LABS: Results for orders placed in visit on 04/13/13  POCT CBC      Result Value Range   WBC 4.0 (*) 4.6 - 10.2 K/uL   Lymph, poc 1.4  0.6 - 3.4   POC LYMPH PERCENT 35.8  10 - 50 %L   MID (cbc) 0.3  0 - 0.9   POC MID % 6.7  0 - 12 %M   POC Granulocyte 2.3  2 - 6.9   Granulocyte percent 57.5  37 - 80 %G   RBC 5.17  4.69 - 6.13 M/uL   Hemoglobin 14.1  14.1 - 18.1 g/dL   HCT, POC 96.0  45.4 - 53.7 %   MCV 88.5  80 - 97 fL   MCH, POC 27.3  27 - 31.2 pg   MCHC 30.8 (*) 31.8 - 35.4 g/dL   RDW, POC 09.8     Platelet Count, POC 212  142 - 424 K/uL   MPV 8.2  0 - 99.8 fL  IFOBT (OCCULT BLOOD)      Result Value Range   IFOBT Negative       EKG/XRAY:   Primary read interpreted by Dr. Conley Rolls at [facility].   ASSESSMENT/PLAN: Encounter Diagnoses  Name Primary?  . Other malaise and fatigue Yes  . Anemia   . Pain in joint, lower leg, unspecified laterality   . Memory loss   . Swollen gland    Labs pending Hgb is no longer low, his IFOBT is negative, Iron studies negative Swollen gland recheck-it is smaller and less painful since being on amoxacillin F/u in 1 week after amoxacillin is completed for f/u of swollen gland     LE, THAO PHUONG, DO 04/13/2013 11:33 AM

## 2013-04-15 ENCOUNTER — Telehealth: Payer: Self-pay

## 2013-04-15 LAB — GC/CHLAMYDIA PROBE AMP
CT Probe RNA: NEGATIVE
GC Probe RNA: NEGATIVE

## 2013-04-15 NOTE — Telephone Encounter (Signed)
Patient would like someone to call him about his lab results.

## 2013-04-16 NOTE — Telephone Encounter (Signed)
Called him, labs negative

## 2013-04-16 NOTE — Telephone Encounter (Signed)
Already addressed

## 2013-04-16 NOTE — Telephone Encounter (Signed)
Left message for him to call back.  

## 2013-04-17 LAB — LEAD, BLOOD: Lead-Whole Blood: 18 ug/dL — ABNORMAL HIGH (ref <10)

## 2013-04-18 ENCOUNTER — Encounter: Payer: Self-pay | Admitting: Radiology

## 2013-05-10 ENCOUNTER — Ambulatory Visit (INDEPENDENT_AMBULATORY_CARE_PROVIDER_SITE_OTHER): Payer: BLUE CROSS/BLUE SHIELD | Admitting: Family Medicine

## 2013-05-10 VITALS — BP 110/66 | HR 67 | Temp 98.1°F | Resp 16 | Ht 72.5 in | Wt 153.8 lb

## 2013-05-10 DIAGNOSIS — S76319D Strain of muscle, fascia and tendon of the posterior muscle group at thigh level, unspecified thigh, subsequent encounter: Secondary | ICD-10-CM

## 2013-05-10 DIAGNOSIS — R599 Enlarged lymph nodes, unspecified: Secondary | ICD-10-CM

## 2013-05-10 DIAGNOSIS — Z5189 Encounter for other specified aftercare: Secondary | ICD-10-CM

## 2013-05-10 DIAGNOSIS — R590 Localized enlarged lymph nodes: Secondary | ICD-10-CM

## 2013-05-10 NOTE — Progress Notes (Signed)
[facility]:  Office Visit  Chief Complaint:  Chief Complaint  Patient presents with  . Follow-up    HPI: Willie Farmer is a 37 y.o. male who complains of : 1. LAD resolved on right side s/p amoxacillin 2. Hamstrings are tight, they feel warm and tingling, they are annoying, dull warm pain the last time and unbearable so could not drive up to [place] to visit his sister. HE has been taking otc lipoic acid supplement and it seems to be working the pain is there but his kidneys feels tingling. 3. He has ahd his lead level checked since the last time we did it, lead was 18 on our office and when he got it rechecked at work it was 16. So it is trending down. Gets regular lead levels checked at work, monthly.   Past Medical History  Diagnosis Date  . Meningitis, viral 1995  . Acid reflux    Past Surgical History  Procedure Laterality Date  . Wisdom tooth extraction     History   Social History  . Marital Status: Married    Spouse Name: N/A    Number of Children: N/A  . Years of Education: N/A   Occupational History  . Welder    Social History Main Topics  . Smoking status: Never Smoker   . Smokeless tobacco: Never Used  . Alcohol Use: 0.6 oz/week    1 Cans of beer per week     Comment: stopped drinking daily, may drink once every 3 weeks  . Drug Use: No  . Sexually Active: Yes   Other Topics Concern  . None   Social History Narrative  . None   Family History  Problem Relation Age of Onset  . Prostate cancer Father   . Prostate cancer Paternal Grandfather   . Prostate cancer Paternal Uncle    Allergies  Allergen Reactions  . Nsaids     Intolerance   Prior to Admission medications   Medication Sig Start Date End Date Taking? Authorizing Provider  amoxicillin (AMOXIL) 500 MG capsule Take 1 capsule (500 mg total) by mouth 2 (two) times daily. 04/10/13   Deloss Amico P Chas Axel, DO     ROS: The patient denies fevers, chills, night sweats, unintentional weight  loss, chest pain, palpitations, wheezing, dyspnea on exertion, nausea, vomiting, abdominal pain, dysuria, hematuria, melena, weakness,  All other systems have been reviewed and were otherwise negative with the exception of those mentioned in the HPI and as above.    PHYSICAL EXAM: Filed Vitals:   05/10/13 1716  BP: 110/66  Pulse: 67  Temp: 98.1 F (36.7 C)  Resp: 16   Filed Vitals:   05/10/13 1716  Height: 6' 0.5" (1.842 m)  Weight: 153 lb 12.8 oz (69.763 kg)   Body mass index is 20.56 kg/(m^2).  General: Alert, no acute distress HEENT:  Normocephalic, atraumatic, oropharynx patent.  Cardiovascular:  Regular rate and rhythm, no rubs murmurs or gallops.  No Carotid bruits, radial pulse intact. No pedal edema.  Respiratory: Clear to auscultation bilaterally.  No wheezes, rales, or rhonchi.  No cyanosis, no use of accessory musculature GI: No organomegaly, abdomen is soft and non-tender, positive bowel sounds.  No masses. Skin: No rashes. Neurologic: Facial musculature symmetric. Psychiatric: Patient is appropriate throughout our interaction. Lymphatic: Small leftside submandibular LAD, mobile 5-10 mm, nontender Musculoskeletal: Gait intact. 5/5 strength, bialteral tight hamstrings-left greater than right   LABS: Results for orders placed in visit on 04/13/13  GC/CHLAMYDIA PROBE AMP      Result Value Range   CT Probe RNA NEGATIVE     GC Probe RNA NEGATIVE    COMPREHENSIVE METABOLIC PANEL      Result Value Range   Sodium 140  135 - 145 mEq/L   Potassium 4.2  3.5 - 5.3 mEq/L   Chloride 105  96 - 112 mEq/L   CO2 28  19 - 32 mEq/L   Glucose, Bld 93  70 - 99 mg/dL   BUN 12  6 - 23 mg/dL   Creat 1.61  0.96 - 0.45 mg/dL   Total Bilirubin 0.3  0.3 - 1.2 mg/dL   Alkaline Phosphatase 42  39 - 117 U/L   AST 25  0 - 37 U/L   ALT 18  0 - 53 U/L   Total Protein 7.7  6.0 - 8.3 g/dL   Albumin 5.1  3.5 - 5.2 g/dL   Calcium 9.8  8.4 - 40.9 mg/dL  TSH      Result Value Range   TSH  0.605  0.350 - 4.500 uIU/mL  HIV ANTIBODY (ROUTINE TESTING)      Result Value Range   HIV NON REACTIVE  NON REACTIVE  LEAD, BLOOD      Result Value Range   Lead-Whole Blood 18 (*) <10 mcg/dL  POCT CBC      Result Value Range   WBC 4.0 (*) 4.6 - 10.2 K/uL   Lymph, poc 1.4  0.6 - 3.4   POC LYMPH PERCENT 35.8  10 - 50 %L   MID (cbc) 0.3  0 - 0.9   POC MID % 6.7  0 - 12 %M   POC Granulocyte 2.3  2 - 6.9   Granulocyte percent 57.5  37 - 80 %G   RBC 5.17  4.69 - 6.13 M/uL   Hemoglobin 14.1  14.1 - 18.1 g/dL   HCT, POC 81.1  91.4 - 53.7 %   MCV 88.5  80 - 97 fL   MCH, POC 27.3  27 - 31.2 pg   MCHC 30.8 (*) 31.8 - 35.4 g/dL   RDW, POC 78.2     Platelet Count, POC 212  142 - 424 K/uL   MPV 8.2  0 - 99.8 fL  IFOBT (OCCULT BLOOD)      Result Value Range   IFOBT Negative       EKG/XRAY:   Primary read interpreted by Dr. Conley Rolls at [facility].   ASSESSMENT/PLAN: Encounter Diagnoses  Name Primary?  . Hamstring strain, unspecified laterality, subsequent encounter Yes  . LAD (lymphadenopathy), cervical    Rx hamstring exercises Advise to stop taking lipoic acid since already in many foods No needs for abx for LAD, he has had cough and allergy sxs so most likely related to that.  F/u prn    Wreatha Sturgeon PHUONG, DO 05/10/2013 6:41 PM

## 2013-05-10 NOTE — Patient Instructions (Signed)
Hamstring Syndrome with Rehab Hamstring syndrome is a rare condition that causes pain and sometimes loss of feeling in the back of the thigh, often to the bottom of the foot. Hamstring syndrome is caused by pressure on the sciatic nerve in the hip, by a fibrous tissue that extends between two of the hamstring muscles on the backside of the thigh. The sciatic nerve may also be compressed between the muscles and bones of the pelvis. The hamstring is a collection of three muscles located on the backside of the thigh, that are responsible for straightening the hip and bending the knee. The hamstring muscles are important for walking, running, and jumping. The sciatic nerve usually passes near these muscles, and the pelvis runs under these muscles, in the thigh.  SYMPTOMS   Tingling, numbness, or burning in the back of the thigh to the back of the knee, and sometimes to the bottom of the foot.  Tenderness in the buttock.  Pain and discomfort (burning or dull ache) in the hip or groin, mid-buttock area, the back of the thigh, and sometimes to the knee.  Heaviness or fatigue in the leg.  Pain that worsens when sitting, running fast, kicking, or trying to stretch the hamstring muscles.  Pain that is less strong when laying flat on the back. CAUSES  Hamstring syndrome is caused by pressure on the sciatic nerve in the hip, by either a fibrous tissue or bone. RISK INCREASES WITH:  Sports that require jumping, sprinting, hurdling, or sitting.  Kicking sports (like soccer and football kickers).  Recurring hamstring muscle strains.  Poor strength and flexibility. PREVENTION   Warm up and stretch properly before activity.  Maintain physical fitness:  Strength, flexibility, and endurance.  Cardiovascular fitness.  Learn and use proper exercise technique. PROGNOSIS  If treated properly, hamstring syndrome usually goes away in 2 to 6 weeks. Occasionally, hamstring syndrome goes away on its own.  Rarely, surgery is necessary. RELATED COMPLICATIONS   Permanent numbness in the affected knee, leg, and foot.  Persistent pain in the knee, leg, and foot.  Increasing weakness of the leg.  Disability and inability to compete in sports. TREATMENT  Treatment first involves resting from activities that aggravate your symptoms. The use of anti-inflammatory medications will help reduce pain and inflammation. Strengthening and stretching exercises are important for reducing the severity of symptoms. These exercises may be completed at home or with a therapist. Corticosteroid injections may be given to help reduce inflammation and reduce pain. If non-surgical treatment is unsuccessful, then surgery may be needed, to free the compressed nerve.  MEDICATION  If pain medicine is needed, nonsteroidal anti-inflammatory medicines (aspirin and ibuprofen), or other minor pain relievers (acetaminophen), are often advised.  Do not take pain medicine for 7 days before surgery.  Prescription pain relievers may be given if your caregiver thinks they are needed. Use only as directed and only as much as you need.  Corticosteroid injections may be recommended. However, these injections should only be used for serious cases, as they can only be given a certain number of times. HEAT AND COLD  Cold treatment (icing) relieves pain and reduces inflammation. Cold treatment should be applied for 10 to 15 minutes every 2 to 3 hours, and immediately after activity that aggravates your symptoms. Use ice packs or an ice massage.  Heat treatment may be used before performing the stretching and strengthening activities prescribed by your caregiver, physical therapist, or athletic trainer. Use a heat pack or a warm water  soak. SEEK MEDICAL CARE IF:  Symptoms get worse or do not improve in 2 weeks, despite treatment.  New, unexplained symptoms develop. (Drugs used in treatment may produce side effects.) EXERCISES RANGE OF  MOTION (ROM)AND STRETCHING EXERCISES - Hamstring Syndrome These exercises may help you when beginning to rehabilitate your injury. Nerves can be easily irritated by excessive or incorrect movements. Only increase your repetitions with your caregiver's permission.Contact your caregiver if your symptoms get worse while doing any of the prescribed exercises. Your symptoms may go away with or without further involvement from your physician, physical therapist or athletic trainer. While completing these exercises, remember:   Restoring tissue flexibility helps normal motion to return to the joints. This allows healthier, less painful movement and activity.  An effective stretch should be held for at least 30 seconds.  A stretch should never be painful. You should only feel a gentle lengthening or release in the stretched tissue. STRETCH - Hamstrings, Standing  Stand or sit, and extend your right / left leg, placing your foot on a chair or foot stool.  Keep a slight arch in your low back and your hips straight forward.  Lead with your chest and lean forward at the waist, until you feel a gentle stretch in the back of your right / left knee or thigh. (When done correctly, this exercise requires leaning only a small distance.)  Hold this position for __________ seconds. Repeat __________ times. Complete this stretch __________ times per day. STRETCH  Hamstrings, Supine   Lie on your back. Loop a belt or towel over the ball of your right / left foot.  Straighten your right / left knee, and slowly pull on the belt to raise your leg. Do not allow the right / left knee to bend. Keep your opposite leg flat on the floor.  Raise the leg until you feel a gentle stretch behind your right / left knee or thigh. Hold this position for __________ seconds. Repeat __________ times. Complete this stretch __________ times per day.  STRETCH - Hamstrings, Doorway  Lie on your back with your right / left leg  extended and resting on the wall, and the opposite leg flat on the ground through the door. Initially, position your bottom farther away from the wall.  Keep your right / left knee straight. If you feel a stretch behind your knee or thigh, hold this position for __________ seconds.  If you do not feel a stretch, scoot your bottom closer to the door and hold __________ seconds. Repeat __________ times. Complete this stretch __________ times per day.  STRETCH - Hamstrings/Adductors, V-Sit   Sit on the floor with your legs extended in a large "V," keeping your knees straight.  With your head and chest upright, bend at your waist reaching for your left foot to stretch your right thigh muscles.  You should feel a stretch in your right inner thigh. Hold for __________ seconds.  Return to the upright position to relax your leg muscles.  Continuing to keep your chest upright, bend straight forward at your waist to stretch your hamstrings.  You should feel a stretch behind both of your thighs and knees. Hold for __________ seconds.  Return to the upright position to relax your leg muscles.  With your head and chest upright, bend at your waist reaching for your right foot to stretch your left thigh muscles.  You should feel a stretch in your left inner thigh. Hold for __________ seconds.  Return to  the upright position to relax your leg muscles. Repeat __________ times. Complete this exercise __________ times per day.  MOBILIZATION EXERCISES - Hamstring Syndrome Mobilization exercises help trapped nerves to glide freely. When nerves have extra pressure on them, or when they get anchored down by surrounding tissues, they can cause pain, numbness or tingling. When completing a mobilization exercise, remember:  Nerves are very sensitive tissue. They must be mobilized very gently. Never force a motion and do not push through discomfort.  Mobilize nerves slowly.  Nerves can be very long. Be sure  to position all of your body parts exactly as described. MOBILIZATION - Nerve Root   Sit on a firm surface that is high enough for your right / left foot to swing freely. You may place a folded towel under your right / left thigh, if helpful.  Sit with a rounded or slouched back. Drop your head forward.  Keeping your right / left foot relaxed, slowly straighten your right / left knee, until it is fully extended or you feel a slight pull behind your knee or calf.  If you do not feel a slight pull, slowly draw your foot and toes toward you.  Hold for __________ seconds. Release the tension in your knee and ankle slowly. Repeat __________ times. Complete this exercise __________ times per day. Document Released: 10/31/2005 Document Revised: 01/23/2012 Document Reviewed: 02/12/2009 [facility] Patient Information 2014 [place], [place].

## 2013-06-27 ENCOUNTER — Ambulatory Visit: Payer: BLUE CROSS/BLUE SHIELD

## 2013-06-27 ENCOUNTER — Ambulatory Visit (INDEPENDENT_AMBULATORY_CARE_PROVIDER_SITE_OTHER): Payer: BLUE CROSS/BLUE SHIELD | Admitting: Family Medicine

## 2013-06-27 VITALS — BP 122/76 | HR 58 | Temp 97.8°F | Resp 18 | Ht 72.5 in | Wt 152.2 lb

## 2013-06-27 DIAGNOSIS — M545 Low back pain, unspecified: Secondary | ICD-10-CM

## 2013-06-27 DIAGNOSIS — R109 Unspecified abdominal pain: Secondary | ICD-10-CM

## 2013-06-27 LAB — POCT UA - MICROSCOPIC ONLY
Bacteria, U Microscopic: NEGATIVE
Casts, Ur, LPF, POC: NEGATIVE
Crystals, Ur, HPF, POC: NEGATIVE
RBC, urine, microscopic: NEGATIVE
WBC, Ur, HPF, POC: NEGATIVE
Yeast, UA: NEGATIVE

## 2013-06-27 LAB — POCT URINALYSIS DIPSTICK
Bilirubin, UA: NEGATIVE
Blood, UA: NEGATIVE
Glucose, UA: NEGATIVE
Ketones, UA: NEGATIVE
Leukocytes, UA: NEGATIVE
Nitrite, UA: NEGATIVE
Protein, UA: NEGATIVE
Spec Grav, UA: 1.02
Urobilinogen, UA: 0.2
pH, UA: 6

## 2013-06-27 MED ORDER — TRAMADOL HCL 50 MG PO TABS: 50.0000 mg | ORAL_TABLET | Freq: Three times a day (TID) | ORAL | Status: DC | PRN

## 2013-06-27 NOTE — Progress Notes (Signed)
[facility]:  Office Visit  Chief Complaint:  Chief Complaint  Patient presents with  . Back Pain    lower back pain ; 2 weeks     HPI: Tyshon Fanning is a 37 y.o. male who complains of bilateral flank pain for last 2 weeks, worse in last 4-5 days  He denies history of kidney stones or pain with urination, He denies having constipation BM does not make it better, last BM was 2 days, denies strain Localized pain in upper abd pain and flank bilaterally Not associated with food or movement Denies any new abdominal exercise  Past Medical History  Diagnosis Date  . Meningitis, viral 1995  . Acid reflux    Past Surgical History  Procedure Laterality Date  . Wisdom tooth extraction     History   Social History  . Marital Status: Married    Spouse Name: N/A    Number of Children: N/A  . Years of Education: N/A   Occupational History  . Welder    Social History Main Topics  . Smoking status: Never Smoker   . Smokeless tobacco: Never Used  . Alcohol Use: 0.6 oz/week    1 Cans of beer per week     Comment: stopped drinking daily, may drink once every 3 weeks  . Drug Use: No  . Sexual Activity: Yes   Other Topics Concern  . None   Social History Narrative  . None   Family History  Problem Relation Age of Onset  . Prostate cancer Father   . Prostate cancer Paternal Grandfather   . Prostate cancer Paternal Uncle    Allergies  Allergen Reactions  . Nsaids     Intolerance   Prior to Admission medications   Not on File     ROS: The patient denies fevers, chills, night sweats, unintentional weight loss, chest pain, palpitations, wheezing, dyspnea on exertion, nausea, vomiting, dysuria, hematuria, melena, numbness, weakness, or tingling.   All other systems have been reviewed and were otherwise negative with the exception of those mentioned in the HPI and as above.    PHYSICAL EXAM: Filed Vitals:   06/27/13 0958  BP: 122/76  Pulse: 58   Temp: 97.8 F (36.6 C)  Resp: 18   Filed Vitals:   06/27/13 0958  Height: 6' 0.5" (1.842 m)  Weight: 152 lb 3.2 oz (69.037 kg)   Body mass index is 20.35 kg/(m^2).  General: Alert, no acute distress HEENT:  Normocephalic, atraumatic, oropharynx patent.  Cardiovascular:  Regular rate and rhythm, no rubs murmurs or gallops.  No Carotid bruits, radial pulse intact. No pedal edema.  Respiratory: Clear to auscultation bilaterally.  No wheezes, rales, or rhonchi.  No cyanosis, no use of accessory musculature GI: No organomegaly, abdomen is soft + diffuse flank tenderness bilaterally, positive bowel sounds.  No masses. Skin: No rashes. Neurologic: Facial musculature symmetric. Psychiatric: Patient is appropriate throughout our interaction. Lymphatic: No cervical lymphadenopathy Musculoskeletal: Gait intact.   LABS: Results for orders placed in visit on 06/27/13  POCT URINALYSIS DIPSTICK      Result Value Range   Color, UA yellow     Clarity, UA clear     Glucose, UA neg     Bilirubin, UA neg     Ketones, UA neg     Spec Grav, UA 1.020     Blood, UA neg     pH, UA 6.0     Protein, UA neg  Urobilinogen, UA 0.2     Nitrite, UA neg     Leukocytes, UA Negative    POCT UA - MICROSCOPIC ONLY      Result Value Range   WBC, Ur, HPF, POC neg     RBC, urine, microscopic neg     Bacteria, U Microscopic neg     Mucus, UA trace     Epithelial cells, urine per micros 0-2     Crystals, Ur, HPF, POC neg     Casts, Ur, LPF, POC neg     Yeast, UA neg       EKG/XRAY:   Primary read interpreted by Dr. Conley Rolls at [facility]. No free air, no obstruction No obvious renal stones but please comment on small opacity onf right side near  Right 11-12 th rib   ASSESSMENT/PLAN: Encounter Diagnoses  Name Primary?  . Lower back pain Yes  . Abdominal  pain, other specified site    ? Ureteral colic vs constipation NSAID 400 mg q8hrs Tramadol Push fluids, miralax F/u prn    Benjermin Korber PHUONG,  DO 06/27/2013 11:18 AM

## 2013-06-27 NOTE — Patient Instructions (Addendum)
Ureteral Colic Ureteral colic is spasm-like pain from the kidney or the ureter. This is often caused by a kidney stone. The pain is caused by the stone trying to get through the tubes that pass your pee. HOME CARE   Drink enough fluids to keep your pee (urine) clear or pale yellow.  Strain all your pee. A strainer will be provided. Keep anything caught in the strainer and bring it to your doctor. The stone causing the pain may be very small.  Only take medicine as told by your doctor.  Follow up with your doctor as told. GET HELP RIGHT AWAY IF:   Pain is not controlled with medicine.  Pain continues or gets worse.  The pain changes and there is chest or belly (abdominal) pain.  You pass out (faint).  You cannot pee.  You keep throwing up (vomiting).  You have a temperature by mouth above 102 F (38.9 C), not controlled by medicine. MAKE SURE YOU:   Understand these instructions.  Will watch this condition.  Will get help right away if you are not doing well or get worse. Document Released: 04/18/2008 Document Revised: 01/23/2012 Document Reviewed: 04/18/2008 [facility] Patient Information 2014 [place], [place]. Abdominal Pain (Nonspecific) Your exam might not show the exact reason you have abdominal pain. Since there are many different causes of abdominal pain, another checkup and more tests may be needed. It is very important to follow up for lasting (persistent) or worsening symptoms. A possible cause of abdominal pain in any person who still has his or her appendix is acute appendicitis. Appendicitis is often hard to diagnose. Normal blood tests, urine tests, ultrasound, and CT scans do not completely rule out early appendicitis or other causes of abdominal pain. Sometimes, only the changes that happen over time will allow appendicitis and other causes of abdominal pain to be determined. Other potential problems that may require surgery may also take time to become more apparent.  Because of this, it is important that you follow all of the instructions below. HOME CARE INSTRUCTIONS   Rest as much as possible.  Do not eat solid food until your pain is gone.  While adults or children have pain: A diet of water, weak decaffeinated tea, broth or bouillon, gelatin, oral rehydration solutions (ORS), frozen ice pops, or ice chips may be helpful.  When pain is gone in adults or children: Start a light diet (dry toast, crackers, applesauce, or white rice). Increase the diet slowly as long as it does not bother you. Eat no dairy products (including cheese and eggs) and no spicy, fatty, fried, or high-fiber foods.  Use no alcohol, caffeine, or cigarettes.  Take your regular medicines unless your caregiver told you not to.  Take any prescribed medicine as directed.  Only take over-the-counter or prescription medicines for pain, discomfort, or fever as directed by your caregiver. Do not give aspirin to children. If your caregiver has given you a follow-up appointment, it is very important to keep that appointment. Not keeping the appointment could result in a permanent injury and/or lasting (chronic) pain and/or disability. If there is any problem keeping the appointment, you must call to reschedule.  SEEK IMMEDIATE MEDICAL CARE IF:   Your pain is not gone in 24 hours.  Your pain becomes worse, changes location, or feels different.  You or your child has an oral temperature above 102 F (38.9 C), not controlled by medicine.  Your baby is older than 3 months with a rectal temperature  of 102 F (38.9 C) or higher.  Your baby is 67 months old or younger with a rectal temperature of 100.4 F (38 C) or higher.  You have shaking chills.  You keep throwing up (vomiting) or cannot drink liquids.  There is blood in your vomit or you see blood in your bowel movements.  Your bowel movements become dark or black.  You have frequent bowel movements.  Your bowel movements stop  (become blocked) or you cannot pass gas.  You have bloody, frequent, or painful urination.  You have yellow discoloration in the skin or whites of the eyes.  Your stomach becomes bloated or bigger.  You have dizziness or fainting.  You have chest or back pain. MAKE SURE YOU:   Understand these instructions.  Will watch your condition.  Will get help right away if you are not doing well or get worse. Document Released: 10/31/2005 Document Revised: 01/23/2012 Document Reviewed: 09/28/2009 [facility] Patient Information 2014 [place], [place].

## 2013-06-28 ENCOUNTER — Telehealth: Payer: Self-pay | Admitting: Family Medicine

## 2013-06-28 NOTE — Telephone Encounter (Signed)
Spoke to pt about xray results.

## 2013-07-01 ENCOUNTER — Ambulatory Visit (INDEPENDENT_AMBULATORY_CARE_PROVIDER_SITE_OTHER): Payer: BLUE CROSS/BLUE SHIELD | Admitting: Family Medicine

## 2013-07-01 VITALS — BP 132/78 | HR 58 | Temp 98.0°F | Resp 16 | Ht 73.0 in | Wt 153.2 lb

## 2013-07-01 DIAGNOSIS — R109 Unspecified abdominal pain: Secondary | ICD-10-CM

## 2013-07-01 DIAGNOSIS — N23 Unspecified renal colic: Secondary | ICD-10-CM

## 2013-07-01 LAB — COMPREHENSIVE METABOLIC PANEL
ALT: 15 U/L (ref 0–53)
AST: 22 U/L (ref 0–37)
Alkaline Phosphatase: 38 U/L — ABNORMAL LOW (ref 39–117)
BUN: 9 mg/dL (ref 6–23)
Creat: 1.08 mg/dL (ref 0.50–1.35)
Total Bilirubin: 0.3 mg/dL (ref 0.3–1.2)

## 2013-07-01 LAB — POCT UA - MICROSCOPIC ONLY
Bacteria, U Microscopic: NEGATIVE
Casts, Ur, LPF, POC: NEGATIVE
Crystals, Ur, HPF, POC: NEGATIVE
RBC, urine, microscopic: NEGATIVE
Yeast, UA: NEGATIVE

## 2013-07-01 LAB — POCT URINALYSIS DIPSTICK
Bilirubin, UA: NEGATIVE
Blood, UA: NEGATIVE
Glucose, UA: NEGATIVE
Ketones, UA: NEGATIVE
Leukocytes, UA: NEGATIVE
Nitrite, UA: NEGATIVE
Protein, UA: NEGATIVE
Spec Grav, UA: >=1.03
Urobilinogen, UA: 0.2
pH, UA: 5.5

## 2013-07-01 LAB — POCT CBC
Granulocyte percent: 52.3 % (ref 37–80)
HCT, POC: 42.6 % — AB (ref 43.5–53.7)
Hemoglobin: 13.3 g/dL — AB (ref 14.1–18.1)
Lymph, poc: 1.6 (ref 0.6–3.4)
MCH, POC: 27.3 pg (ref 27–31.2)
MCHC: 31.2 g/dL — AB (ref 31.8–35.4)
MCV: 87.5 fL (ref 80–97)
MID (cbc): 0.3 (ref 0–0.9)
MPV: 7.9 fL (ref 0–99.8)
POC Granulocyte: 2 (ref 2–6.9)
POC LYMPH PERCENT: 40.9 % (ref 10–50)
POC MID %: 6.8 % (ref 0–12)
Platelet Count, POC: 211 10*3/uL (ref 142–424)
RBC: 4.87 M/uL (ref 4.69–6.13)
RDW, POC: 15.2 %
WBC: 3.8 10*3/uL — AB (ref 4.6–10.2)

## 2013-07-01 LAB — COMPREHENSIVE METABOLIC PANEL WITH GFR
Albumin: 4.8 g/dL (ref 3.5–5.2)
CO2: 26 meq/L (ref 19–32)
Calcium: 10 mg/dL (ref 8.4–10.5)
Chloride: 104 meq/L (ref 96–112)
Glucose, Bld: 93 mg/dL (ref 70–99)
Potassium: 4 meq/L (ref 3.5–5.3)
Sodium: 139 meq/L (ref 135–145)
Total Protein: 7.3 g/dL (ref 6.0–8.3)

## 2013-07-01 LAB — LIPASE: Lipase: 30 U/L (ref 0–75)

## 2013-07-01 NOTE — Progress Notes (Signed)
[facility]:  Office Visit  Chief Complaint:  Chief Complaint  Patient presents with  . Abdominal Pain    Here 8/14 with Lumbar pain now in abd    HPI: Willie Farmer is a 37 y.o. male who complains of  Her for recheck of renal colic. He is better. He had 1 episode of nausea and feeling warm. The ibuprofen and tramadol has helped a lot. He has no other changes. Xray of abd showed possible renal stone on right side. HE os feeling the same or maybe minamally better, wants to recheck urine .   Past Medical History  Diagnosis Date  . Meningitis, viral 1995  . Acid reflux    Past Surgical History  Procedure Laterality Date  . Wisdom tooth extraction     History   Social History  . Marital Status: Married    Spouse Name: N/A    Number of Children: N/A  . Years of Education: N/A   Occupational History  . Welder    Social History Main Topics  . Smoking status: Never Smoker   . Smokeless tobacco: Never Used  . Alcohol Use: 0.6 oz/week    1 Cans of beer per week     Comment: stopped drinking daily, may drink once every 3 weeks  . Drug Use: No  . Sexual Activity: Yes   Other Topics Concern  . None   Social History Narrative  . None   Family History  Problem Relation Age of Onset  . Prostate cancer Father   . Prostate cancer Paternal Grandfather   . Prostate cancer Paternal Uncle    Allergies  Allergen Reactions  . Nsaids     Intolerance   Prior to Admission medications   Medication Sig Start Date End Date Taking? Authorizing Provider  traMADol (ULTRAM) 50 MG tablet Take 1 tablet (50 mg total) by mouth every 8 (eight) hours as needed for pain. 06/27/13  Yes Thao P Le, DO     ROS: The patient denies fevers, chills, night sweats, unintentional weight loss, chest pain, palpitations, wheezing, dyspnea on exertion, vomiting,  dysuria, hematuria, melena, numbness, weakness, or tingling.   All other systems have been reviewed and were otherwise negative  with the exception of those mentioned in the HPI and as above.    PHYSICAL EXAM: Filed Vitals:   07/01/13 0840  BP: 132/78  Pulse: 58  Temp: 98 F (36.7 C)  Resp: 16   Filed Vitals:   07/01/13 0840  Height: 6\' 1"  (1.854 m)  Weight: 153 lb 3.2 oz (69.491 kg)   Body mass index is 20.22 kg/(m^2).  General: Alert, no acute distress HEENT:  Normocephalic, atraumatic, oropharynx patent. EOMI, PERRLA Cardiovascular:  Regular rate and rhythm, no rubs murmurs or gallops.  No Carotid bruits, radial pulse intact. No pedal edema.  Respiratory: Clear to auscultation bilaterally.  No wheezes, rales, or rhonchi.  No cyanosis, no use of accessory musculature GI: No organomegaly, abdomen is soft and non-tender, positive bowel sounds.  No masses. Skin: No rashes. Neurologic: Facial musculature symmetric. Psychiatric: Patient is appropriate throughout our interaction. Lymphatic: No cervical lymphadenopathy Musculoskeletal: Gait intact. No flank tenderness   LABS: Results for orders placed in visit on 07/01/13  POCT URINALYSIS DIPSTICK      Result Value Range   Color, UA yellow     Clarity, UA clear     Glucose, UA neg     Bilirubin, UA neg     Ketones, UA  neg     Spec Grav, UA >=1.030     Blood, UA neg     pH, UA 5.5     Protein, UA neg     Urobilinogen, UA 0.2     Nitrite, UA neg     Leukocytes, UA Negative    POCT UA - MICROSCOPIC ONLY      Result Value Range   WBC, Ur, HPF, POC 0-2     RBC, urine, microscopic neg     Bacteria, U Microscopic neg     Mucus, UA trace     Epithelial cells, urine per micros 0-1     Crystals, Ur, HPF, POC neg     Casts, Ur, LPF, POC neg     Yeast, UA neg    POCT CBC      Result Value Range   WBC 3.8 (*) 4.6 - 10.2 K/uL   Lymph, poc 1.6  0.6 - 3.4   POC LYMPH PERCENT 40.9  10 - 50 %L   MID (cbc) 0.3  0 - 0.9   POC MID % 6.8  0 - 12 %M   POC Granulocyte 2.0  2 - 6.9   Granulocyte percent 52.3  37 - 80 %G   RBC 4.87  4.69 - 6.13 M/uL    Hemoglobin 13.3 (*) 14.1 - 18.1 g/dL   HCT, POC 16.1 (*) 09.6 - 53.7 %   MCV 87.5  80 - 97 fL   MCH, POC 27.3  27 - 31.2 pg   MCHC 31.2 (*) 31.8 - 35.4 g/dL   RDW, POC 04.5     Platelet Count, POC 211  142 - 424 K/uL   MPV 7.9  0 - 99.8 fL     EKG/XRAY:   Primary read interpreted by Dr. Conley Rolls at [facility].   ASSESSMENT/PLAN: Encounter Diagnoses  Name Primary?  . Abdominal  pain, other specified site Yes  . Renal colic on right side     Follow-up in 2 months for repeat CBC due to low white count Labs pending Go to ER prn Gross sideeffects, risk and benefits, and alternatives of medications d/w patient. Patient is aware that all medications have potential sideeffects and we are unable to predict every sideeffect or drug-drug interaction that may occur.  Hamilton Capri PHUONG, DO 07/01/2013 9:37 AM

## 2013-10-27 IMAGING — CR DG ABDOMEN 2V
2 series · 2 of 2 positions shown · non-contrast
Comparison: Acute abdominal series 07/30/2012

CLINICAL DATA: Abdominal pain

ABDOMEN - 2 VIEW

[AP (1 of 2)]
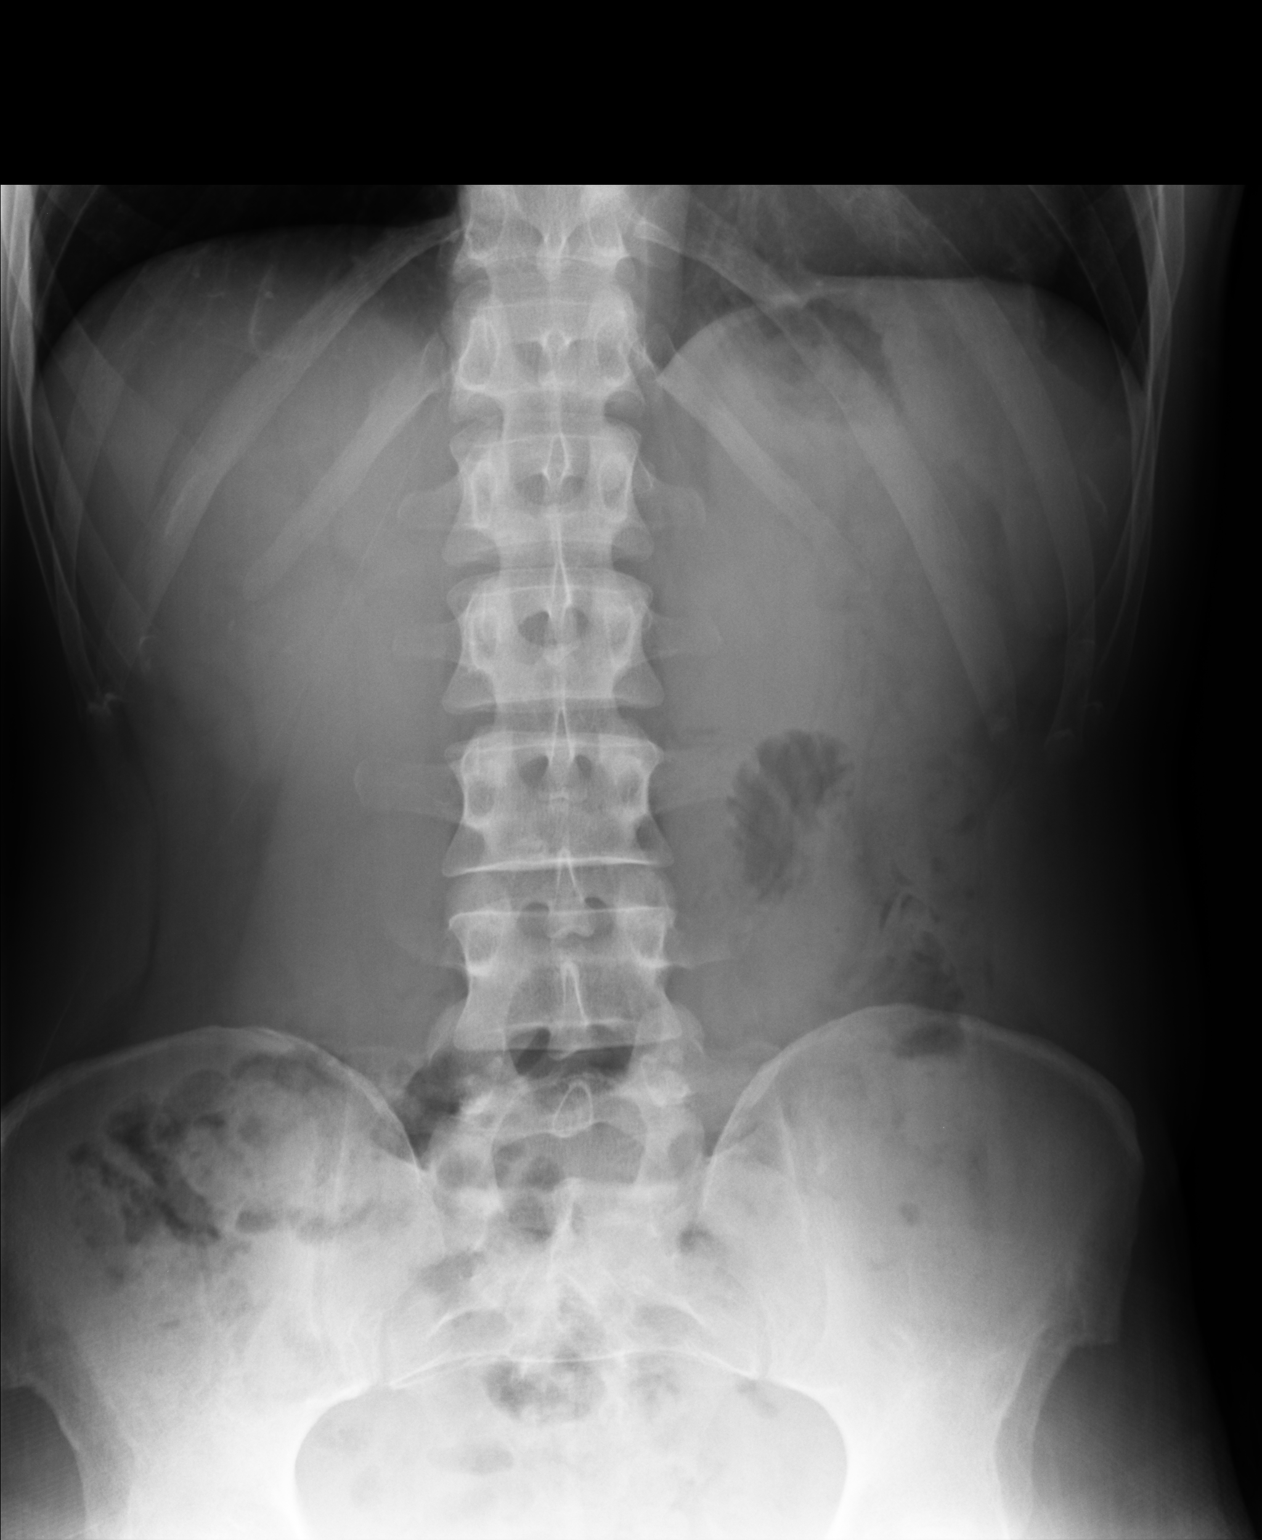

[AP (2 of 2)]
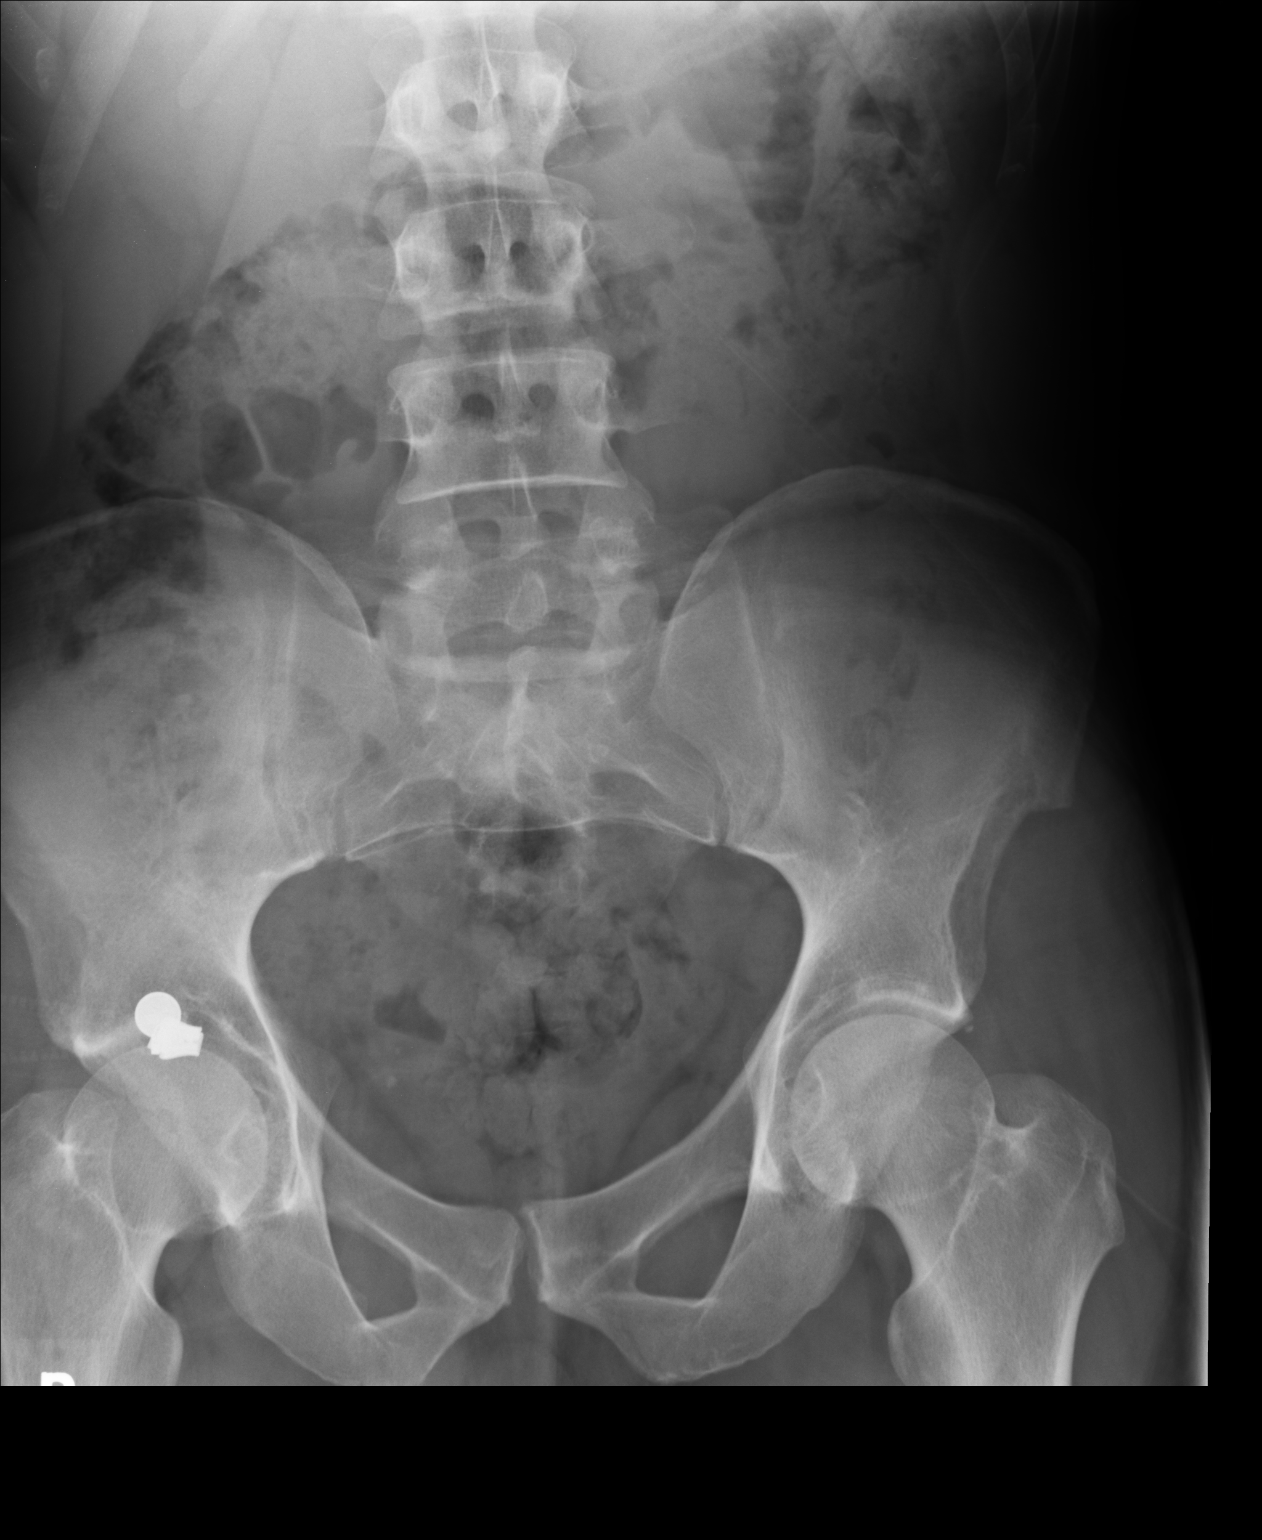

[2 of 2 positions shown; findings below may reference images not displayed]

FINDINGS: Lung bases are clear.  The bowel gas pattern is
nonobstructive.  Metallic rounded an irregularly shaped densities
project over the right hip; question if there is a metallic object
in the patient's pocket or overlying the patient.

There is a 2-3 mm of oval to linear density projecting over the
lower pole right renal shadow.  A small right renal stone cannot be
excluded.  No stone is seen along the expected location of the
ureters.  Stable calcified phleboliths in the right aspect of the
pelvis.

No acute osseous abnormality identified.
IMPRESSION: 1.  Cannot exclude a 2-3 mm right renal stone.
2.  Nonobstructive bowel gas pattern.

Clinically significant discrepancy from primary report, if
provided: None

## 2013-11-14 DIAGNOSIS — Z8719 Personal history of other diseases of the digestive system: Secondary | ICD-10-CM

## 2013-11-14 HISTORY — DX: Personal history of other diseases of the digestive system: Z87.19

## 2013-12-10 ENCOUNTER — Ambulatory Visit (INDEPENDENT_AMBULATORY_CARE_PROVIDER_SITE_OTHER): Payer: BLUE CROSS/BLUE SHIELD | Admitting: Emergency Medicine

## 2013-12-10 VITALS — BP 110/70 | HR 68 | Temp 97.4°F | Resp 20 | Ht 74.0 in | Wt 153.0 lb

## 2013-12-10 DIAGNOSIS — G5680 Other specified mononeuropathies of unspecified upper limb: Secondary | ICD-10-CM

## 2013-12-10 MED ORDER — ACETAMINOPHEN-CODEINE #3 300-30 MG PO TABS: 1.0000 | ORAL_TABLET | ORAL | Status: DC | PRN

## 2013-12-10 MED ORDER — NAPROXEN SODIUM 550 MG PO TABS: 550.0000 mg | ORAL_TABLET | Freq: Two times a day (BID) | ORAL | Status: DC

## 2013-12-10 MED ORDER — CYCLOBENZAPRINE HCL 10 MG PO TABS: 10.0000 mg | ORAL_TABLET | Freq: Three times a day (TID) | ORAL | Status: DC | PRN

## 2013-12-10 NOTE — Patient Instructions (Signed)
Shoulder Sprain  A shoulder sprain is the result of damage to the tough, fiber-like tissues (ligaments) that help hold your shoulder in place. The ligaments may be stretched or torn. Besides the main shoulder joint (the ball and socket), there are several smaller joints that connect the bones in this area. A sprain usually involves one of those joints. Most often it is the acromioclavicular (or AC) joint. That is the joint that connects the collarbone (clavicle) and the shoulder blade (scapula) at the top point of the shoulder blade (acromion).  A shoulder sprain is a mild form of what is called a shoulder separation. Recovering from a shoulder sprain may take some time. For some, pain lingers for several months. Most people recover without long term problems.  CAUSES    A shoulder sprain is usually caused by some kind of trauma. This might be:   Falling on an outstretched arm.   Being hit hard on the shoulder.   Twisting the arm.   Shoulder sprains are more likely to occur in people who:   Play sports.   Have balance or coordination problems.  SYMPTOMS    Pain when you move your shoulder.   Limited ability to move the shoulder.   Swelling and tenderness on top of the shoulder.   Redness or warmth in the shoulder.   Bruising.   A change in the shape of the shoulder.  DIAGNOSIS   Your healthcare provider may:   Ask about your symptoms.   Ask about recent activity that might have caused those symptoms.   Examine your shoulder. You may be asked to do simple exercises to test movement. The other shoulder will be examined for comparison.   Order some tests that provide a look inside the body. They can show the extent of the injury. The tests could include:   X-rays.   CT (computed tomography) scan.   MRI (magnetic resonance imaging) scan.  RISKS AND COMPLICATIONS   Loss of full shoulder motion.   Ongoing shoulder pain.  TREATMENT   How long it takes to recover from a shoulder sprain depends on how  severe it was. Treatment options may include:   Rest. You should not use the arm or shoulder until it heals.   Ice. For 2 or 3 days after the injury, put an ice pack on the shoulder up to 4 times a day. It should stay on for 15 to 20 minutes each time. Wrap the ice in a towel so it does not touch your skin.   Over-the-counter medicine to relieve pain.   A sling or brace. This will keep the arm still while the shoulder is healing.   Physical therapy or rehabilitation exercises. These will help you regain strength and motion. Ask your healthcare provider when it is OK to begin these exercises.   Surgery. The need for surgery is rare with a sprained shoulder, but some people may need surgery to keep the joint in place and reduce pain.  HOME CARE INSTRUCTIONS    Ask your healthcare provider about what you should and should not do while your shoulder heals.   Make sure you know how to apply ice to the correct area of your shoulder.   Talk with your healthcare provider about which medications should be used for pain and swelling.   If rehabilitation therapy will be needed, ask your healthcare provider to refer you to a therapist. If it is not recommended, then ask about at-home exercises. Find   out when exercise should begin.  SEEK MEDICAL CARE IF:   Your pain, swelling, or redness at the joint increases.  SEEK IMMEDIATE MEDICAL CARE IF:    You have a fever.   You cannot move your arm or shoulder.  Document Released: 03/19/2009 Document Revised: 01/23/2012 Document Reviewed: 03/19/2009  [employer] Patient Information 2014 [employer].
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
## 2013-12-24 ENCOUNTER — Ambulatory Visit (INDEPENDENT_AMBULATORY_CARE_PROVIDER_SITE_OTHER): Payer: BLUE CROSS/BLUE SHIELD | Admitting: Emergency Medicine

## 2013-12-24 VITALS — BP 120/70 | HR 83 | Temp 97.9°F | Resp 18 | Ht 73.0 in | Wt 156.0 lb

## 2013-12-24 DIAGNOSIS — A088 Other specified intestinal infections: Secondary | ICD-10-CM

## 2013-12-24 LAB — POCT CBC
Granulocyte percent: 90.4 %G — AB (ref 37–80)
HCT, POC: 45.6 % (ref 43.5–53.7)
HEMOGLOBIN: 14 g/dL — AB (ref 14.1–18.1)
Lymph, poc: 0.4 — AB (ref 0.6–3.4)
MCH: 27.3 pg (ref 27–31.2)
MCHC: 30.7 g/dL — AB (ref 31.8–35.4)
MCV: 88.8 fL (ref 80–97)
MID (cbc): 0.3 (ref 0–0.9)
MPV: 7.9 fL (ref 0–99.8)
POC Granulocyte: 6 (ref 2–6.9)
POC LYMPH PERCENT: 5.7 %L — AB (ref 10–50)
POC MID %: 3.9 % (ref 0–12)
Platelet Count, POC: 203 10*3/uL (ref 142–424)
RBC: 5.13 M/uL (ref 4.69–6.13)
RDW, POC: 16.9 %
WBC: 6.6 10*3/uL (ref 4.6–10.2)

## 2013-12-24 MED ORDER — ONDANSETRON 8 MG PO TBDP
8.0000 mg | ORAL_TABLET | Freq: Three times a day (TID) | ORAL | Status: DC | PRN
Start: 2013-12-24 — End: 2016-07-13

## 2013-12-24 MED ORDER — LOPERAMIDE HCL 2 MG PO TABS: ORAL_TABLET | ORAL | Status: DC

## 2013-12-24 MED ORDER — ONDANSETRON 4 MG PO TBDP
8.0000 mg | ORAL_TABLET | Freq: Once | ORAL | Status: AC
  Administered 2013-12-24: 8 mg via ORAL

## 2013-12-24 NOTE — Patient Instructions (Signed)
Diet The clear liquid diet consists of foods that are liquid or will become liquid at room temperature. Examples of foods allowed on a clear liquid diet include fruit juice, broth or bouillon, gelatin, or frozen ice pops. You should be able to see through the liquid. The purpose of this diet is to provide the necessary fluids, electrolytes (such as sodium and potassium), and energy to keep the body functioning during times when you are not able to consume a regular diet. A clear liquid diet should not be continued for long periods of time, as it is not nutritionally adequate.  A CLEAR LIQUID DIET MAY BE NEEDED:  When a sudden-onset (acute) condition occurs before or after surgery.   As the first step in oral feeding.   For fluid and electrolyte replacement in diarrheal diseases.   As a diet before certain medical tests are performed.  ADEQUACY The clear liquid diet is adequate only in ascorbic acid, according to the Recommended Dietary Allowances of the National Research Council.  CHOOSING FOODS Breads and Starches  Allowed: None are allowed.   Avoid: All are to be avoided.  Vegetables  Allowed: Strained vegetable juices.   Avoid: Any others.  Fruit  Allowed: Strained fruit juices and fruit drinks. Include 1 serving of citrus or vitamin C-enriched fruit juice daily.   Avoid: Any others.  Meat and Meat Substitutes  Allowed: None are allowed.   Avoid: All are to be avoided.  Milk Products  Allowed: None are allowed.   Avoid: All are to be avoided.  Soups and Combination Foods  Allowed: Clear bouillon, broth, or strained broth-based soups.   Avoid: Any others.  Desserts and Sweets  Allowed: Sugar, honey. High-protein gelatin. Flavored gelatin, ices, or frozen ice pops that do not contain milk.   Avoid: Any others.  Fats and Oils  Allowed: None are allowed.   Avoid: All are to be avoided.  Beverages  Allowed: Cereal  beverages, coffee (regular or decaffeinated), tea, or soda at the discretion of your health care provider.   Avoid: Any others.  Condiments  Allowed: Salt.   Avoid: Any others, including pepper.  Supplements  Allowed: Liquid nutrition beverages that you can see through.   Avoid: Any others that contain lactose or fiber. SAMPLE MEAL PLAN Breakfast  4 oz (120 mL) strained orange juice.   to 1 cup (120 to 240 mL) gelatin (plain or fortified).  1 cup (240 mL) beverage (coffee or tea).  Sugar, if desired. Midmorning Snack   cup (120 mL) gelatin (plain or fortified). Lunch  1 cup (240 mL) broth or consomm.  4 oz (120 mL) strained grapefruit juice.   cup (120 mL) gelatin (plain or fortified).  1 cup (240 mL) beverage (coffee or tea).  Sugar, if desired. Midafternoon Snack   cup (120 mL) fruit ice.   cup (120 mL) strained fruit juice. Dinner  1 cup (240 mL) broth or consomm.   cup (120 mL) cranberry juice.   cup (120 mL) flavored gelatin (plain or fortified).  1 cup (240 mL) beverage (coffee or tea).  Sugar, if desired. Evening Snack  4 oz (120 mL) strained apple juice (vitamin C-fortified).   cup (120 mL) flavored gelatin (plain or fortified). MAKE SURE YOU:  Understand these instructions.  Will watch your child's condition.  Will get help right away if your child is not doing well or gets worse. Document Released: 10/31/2005 Document Revised: 07/03/2013 Document Reviewed: 04/02/2013 [employer] Patient Information 2014 [employer]. Viral   Gastroenteritis Viral gastroenteritis is also known as stomach flu. This condition affects the stomach and intestinal tract. It can cause sudden diarrhea and vomiting. The illness typically lasts 3 to 8 days. Most people develop an immune response that eventually gets rid of the virus. While this natural response develops, the virus can make you quite ill. CAUSES  Many different viruses can cause  gastroenteritis, such as rotavirus or noroviruses. You can catch one of these viruses by consuming contaminated food or water. You may also catch a virus by sharing utensils or other personal items with an infected person or by touching a contaminated surface. SYMPTOMS  The most common symptoms are diarrhea and vomiting. These problems can cause a severe loss of body fluids (dehydration) and a body salt (electrolyte) imbalance. Other symptoms may include:  Fever.  Headache.  Fatigue.  Abdominal pain. DIAGNOSIS  Your caregiver can usually diagnose viral gastroenteritis based on your symptoms and a physical exam. A stool sample may also be taken to test for the presence of viruses or other infections. TREATMENT  This illness typically goes away on its own. Treatments are aimed at rehydration. The most serious cases of viral gastroenteritis involve vomiting so severely that you are not able to keep fluids down. In these cases, fluids must be given through an intravenous line (IV). HOME CARE INSTRUCTIONS   Drink enough fluids to keep your urine clear or pale yellow. Drink small amounts of fluids frequently and increase the amounts as tolerated.  Ask your caregiver for specific rehydration instructions.  Avoid:  Foods high in sugar.  Alcohol.  Carbonated drinks.  Tobacco.  Juice.  Caffeine drinks.  Extremely hot or cold fluids.  Fatty, greasy foods.  Too much intake of anything at one time.  Dairy products until 24 to 48 hours after diarrhea stops.  You may consume probiotics. Probiotics are active cultures of beneficial bacteria. They may lessen the amount and number of diarrheal stools in adults. Probiotics can be found in yogurt with active cultures and in supplements.  Wash your hands well to avoid spreading the virus.  Only take over-the-counter or prescription medicines for pain, discomfort, or fever as directed by your caregiver. Do not give aspirin to children.  Antidiarrheal medicines are not recommended.  Ask your caregiver if you should continue to take your regular prescribed and over-the-counter medicines.  Keep all follow-up appointments as directed by your caregiver. SEEK IMMEDIATE MEDICAL CARE IF:   You are unable to keep fluids down.  You do not urinate at least once every 6 to 8 hours.  You develop shortness of breath.  You notice blood in your stool or vomit. This may look like coffee grounds.  You have abdominal pain that increases or is concentrated in one small area (localized).  You have persistent vomiting or diarrhea.  You have a fever.  The patient is a child younger than 3 months, and he or she has a fever.  The patient is a child older than 3 months, and he or she has a fever and persistent symptoms.  The patient is a child older than 3 months, and he or she has a fever and symptoms suddenly get worse.  The patient is a baby, and he or she has no tears when crying. MAKE SURE YOU:   Understand these instructions.  Will watch your condition.  Will get help right away if you are not doing well or get worse. Document Released: 10/31/2005 Document Revised: 01/23/2012 Document Reviewed: 08/17/2011   [employer] Patient Information 2014 [employer].  

## 2013-12-24 NOTE — Progress Notes (Signed)
[facility] [address] [place] [address] [phone]  Date:  12/24/2013   Name:  Willie Farmer   DOB:  [birth year]   MRN:  [id number]  PCP:  No PCP Per Patient    Chief Complaint: Emesis, Diarrhea, Nausea, Hot Flashes and Nephrolithiasis   History of Present Illness:  Willie Farmer is a 38 y.o. very pleasant male patient who presents with the following:  Last night developed nausea and vomiting.  No hematemesis.  Later developed profuse watery diarrhea.  The patient has no complaint of blood, mucous, or pus in her stools.   No ill contacts.   No travel or sketchy water.  No improvement with over the counter medications or other home remedies. Denies other complaint or health concern today.   There are no active problems to display for this patient.   Past Medical History  Diagnosis Date  . Meningitis, viral 1995  . Acid reflux     Past Surgical History  Procedure Laterality Date  . Wisdom tooth extraction      History  Substance Use Topics  . Smoking status: Never Smoker   . Smokeless tobacco: Never Used  . Alcohol Use: 0.6 oz/week    1 Cans of beer per week     Comment: stopped drinking daily, may drink once every 3 weeks    Family History  Problem Relation Age of Onset  . Prostate cancer Father   . Prostate cancer Paternal Grandfather   . Prostate cancer Paternal Uncle     Allergies  Allergen Reactions  . Nsaids     Intolerance    Medication list has been reviewed and updated.  Current Outpatient Prescriptions on File Prior to Visit  Medication Sig Dispense Refill  . acetaminophen-codeine (TYLENOL #3) 300-30 MG per tablet Take 1-2 tablets by mouth every 4 (four) hours as needed.  30 tablet  0  . cyclobenzaprine (FLEXERIL) 10 MG tablet Take 1 tablet (10 mg total) by mouth 3 (three) times daily as needed for muscle spasms.  30 tablet  0  . naproxen sodium (ANAPROX DS) 550 MG tablet Take 1 tablet (550 mg total) by mouth 2 (two) times  daily with a meal.  40 tablet  0   No current facility-administered medications on file prior to visit.    Review of Systems:  As per HPI, otherwise negative.    Physical Examination: Filed Vitals:   12/24/13 0924  BP: 120/70  Pulse: 83  Temp: 97.9 F (36.6 C)  Resp: 18   Filed Vitals:   12/24/13 0924  Height: 6\' 1"  (1.854 m)  Weight: 156 lb (70.761 kg)   Body mass index is 20.59 kg/(m^2). Ideal Body Weight: Weight in (lb) to have BMI = 25: 189.1  GEN: WDWN, NAD, Non-toxic, A & O x 3 HEENT: Atraumatic, Normocephalic. Neck supple. No masses, No LAD. Ears and Nose: No external deformity. CV: RRR, No M/G/R. No JVD. No thrill. No extra heart sounds. PULM: CTA B, no wheezes, crackles, rhonchi. No retractions. No resp. distress. No accessory muscle use. ABD: S, NT, ND, +BS. No rebound. No HSM. EXTR: No c/c/e NEURO Normal gait.  PSYCH: Normally interactive. Conversant. Not depressed or anxious appearing.  Calm demeanor.    Assessment and Plan: Viral gastroenteritis zofran Imodium   Signed,  Phillips Odor, MD   Results for orders placed in visit on 12/24/13  POCT CBC      Result Value Range   WBC 6.6  4.6 - 10.2 K/uL   Lymph, poc 0.4 (*) 0.6 - 3.4   POC LYMPH PERCENT 5.7 (*) 10 - 50 %L   MID (cbc) 0.3  0 - 0.9   POC MID % 3.9  0 - 12 %M   POC Granulocyte 6.0  2 - 6.9   Granulocyte percent 90.4 (*) 37 - 80 %G   RBC 5.13  4.69 - 6.13 M/uL   Hemoglobin 14.0 (*) 14.1 - 18.1 g/dL   HCT, POC 78.245.6  95.643.5 - 53.7 %   MCV 88.8  80 - 97 fL   MCH, POC 27.3  27 - 31.2 pg   MCHC 30.7 (*) 31.8 - 35.4 g/dL   RDW, POC 21.316.9     Platelet Count, POC 203  142 - 424 K/uL   MPV 7.9  0 - 99.8 fL

## 2013-12-25 ENCOUNTER — Telehealth: Payer: Self-pay

## 2013-12-25 NOTE — Telephone Encounter (Signed)
Patient is requesting a revised note for work.   Off 2/10 and 2/11 and needs reason listed on the note for being out of work/school.   [id number]

## 2013-12-25 NOTE — Telephone Encounter (Signed)
Pt came to office to collect corrected note.

## 2014-01-06 ENCOUNTER — Ambulatory Visit (INDEPENDENT_AMBULATORY_CARE_PROVIDER_SITE_OTHER): Payer: BLUE CROSS/BLUE SHIELD | Admitting: Internal Medicine

## 2014-01-06 VITALS — BP 128/80 | HR 70 | Temp 98.2°F | Resp 16 | Ht 72.0 in | Wt 153.0 lb

## 2014-01-06 DIAGNOSIS — S29019A Strain of muscle and tendon of unspecified wall of thorax, initial encounter: Secondary | ICD-10-CM

## 2014-01-06 DIAGNOSIS — S239XXA Sprain of unspecified parts of thorax, initial encounter: Secondary | ICD-10-CM

## 2014-01-06 DIAGNOSIS — K5900 Constipation, unspecified: Secondary | ICD-10-CM

## 2014-01-06 DIAGNOSIS — K602 Anal fissure, unspecified: Secondary | ICD-10-CM

## 2014-01-06 DIAGNOSIS — K600 Acute anal fissure: Secondary | ICD-10-CM

## 2014-01-06 MED ORDER — CYCLOBENZAPRINE HCL 10 MG PO TABS: 10.0000 mg | ORAL_TABLET | Freq: Three times a day (TID) | ORAL | Status: DC | PRN

## 2014-01-06 MED ORDER — ACETAMINOPHEN-CODEINE #3 300-30 MG PO TABS: 1.0000 | ORAL_TABLET | ORAL | Status: DC | PRN

## 2014-01-06 NOTE — Patient Instructions (Signed)
Constipation, Adult Constipation is when a person has fewer than 3 bowel movements a week; has difficulty having a bowel movement; or has stools that are dry, hard, or larger than normal. As people grow older, constipation is more common. If you try to fix constipation with medicines that make you have a bowel movement (laxatives), the problem may get worse. Long-term laxative use may cause the muscles of the colon to become weak. A low-fiber diet, not taking in enough fluids, and taking certain medicines may make constipation worse. CAUSES   Certain medicines, such as antidepressants, pain medicine, iron supplements, antacids, and water pills.   Certain diseases, such as diabetes, irritable bowel syndrome (IBS), thyroid disease, or depression.   Not drinking enough water.   Not eating enough fiber-rich foods.   Stress or travel.  Lack of physical activity or exercise.  Not going to the restroom when there is the urge to have a bowel movement.  Ignoring the urge to have a bowel movement.  Using laxatives too much. SYMPTOMS   Having fewer than 3 bowel movements a week.   Straining to have a bowel movement.   Having hard, dry, or larger than normal stools.   Feeling full or bloated.   Pain in the lower abdomen.  Not feeling relief after having a bowel movement. DIAGNOSIS  Your caregiver will take a medical history and perform a physical exam. Further testing may be done for severe constipation. Some tests may include:   A barium enema X-ray to examine your rectum, colon, and sometimes, your small intestine.  A sigmoidoscopy to examine your lower colon.  A colonoscopy to examine your entire colon. TREATMENT  Treatment will depend on the severity of your constipation and what is causing it. Some dietary treatments include drinking more fluids and eating more fiber-rich foods. Lifestyle treatments may include regular exercise. If these diet and lifestyle recommendations  do not help, your caregiver may recommend taking over-the-counter laxative medicines to help you have bowel movements. Prescription medicines may be prescribed if over-the-counter medicines do not work.  HOME CARE INSTRUCTIONS   Increase dietary fiber in your diet, such as fruits, vegetables, whole grains, and beans. Limit high-fat and processed sugars in your diet, such as [place] fries, hamburgers, cookies, candies, and soda.   A fiber supplement may be added to your diet if you cannot get enough fiber from foods.   Drink enough fluids to keep your urine clear or pale yellow.   Exercise regularly or as directed by your caregiver.   Go to the restroom when you have the urge to go. Do not hold it.  Only take medicines as directed by your caregiver. Do not take other medicines for constipation without talking to your caregiver first. SEEK IMMEDIATE MEDICAL CARE IF:   You have bright red blood in your stool.   Your constipation lasts for more than 4 days or gets worse.   You have abdominal or rectal pain.   You have thin, pencil-like stools.  You have unexplained weight loss. MAKE SURE YOU:   Understand these instructions.  Will watch your condition.  Will get help right away if you are not doing well or get worse. Document Released: 07/29/2004 Document Revised: 01/23/2012 Document Reviewed: 08/12/2013 [facility] Patient Information 2014 [place] [place]. Anal Fissure, Adult An anal fissure is a small tear or crack in the skin around the anus. Bleeding from a fissure usually stops on its own within a few minutes. However, bleeding will often reoccur  with each bowel movement until the crack heals.  CAUSES   Passing large, hard stools.  Frequent diarrheal stools.  Constipation.  Inflammatory bowel disease (Crohn's disease or ulcerative colitis).  Infections.  Anal sex. SYMPTOMS   Small amounts of blood seen on your stools, on toilet paper, or in the toilet after a  bowel movement.  Rectal bleeding.  Painful bowel movements.  Itching or irritation around the anus. DIAGNOSIS Your caregiver will examine the anal area. An anal fissure can usually be seen with careful inspection. A rectal exam may be performed and a short tube (anoscope) may be used to examine the anal canal. TREATMENT   You may be instructed to take fiber supplements. These supplements can soften your stool to help make bowel movements easier.  Sitz baths may be recommended to help heal the tear. Do not use soap in the sitz baths.  A medicated cream or ointment may be prescribed to lessen discomfort. HOME CARE INSTRUCTIONS   Maintain a diet high in fruits, whole grains, and vegetables. Avoid constipating foods like bananas and dairy products.  Take sitz baths as directed by your caregiver.  Drink enough fluids to keep your urine clear or pale yellow.  Only take over-the-counter or prescription medicines for pain, discomfort, or fever as directed by your caregiver. Do not take aspirin as this may increase bleeding.  Do not use ointments containing numbing medications (anesthetics) or hydrocortisone. They could slow healing. SEEK MEDICAL CARE IF:   Your fissure is not completely healed within 3 days.  You have further bleeding.  You have a fever.  You have diarrhea mixed with blood.  You have pain.  Your problem is getting worse rather than better. MAKE SURE YOU:   Understand these instructions.  Will watch your condition.  Will get help right away if you are not doing well or get worse. Document Released: 10/31/2005 Document Revised: 01/23/2012 Document Reviewed: 04/17/2011 [facility] Patient Information 2014 [place], [place].

## 2014-01-06 NOTE — Progress Notes (Signed)
   Subjective:    Patient ID: Willie Farmer, male    DOB: 02/03/1976, 38 y.o.   MRN: [id number]  HPI    Review of Systems     Objective:   Physical Exam        Assessment & Plan:

## 2014-01-06 NOTE — Progress Notes (Signed)
   Subjective:    Patient ID: Willie Farmer, male    DOB: 01/13/1976, 38 y.o.   MRN: [id number]  HPI Pt states he noticed some rectal bleeding three days ago. He states he was very constipated and had to strain, and believes the bleeding may be due to that. He states this also happened yesterday, he had to strain with this bowel movement as well. With both instances, he noticed the blood only with wiping after the bowel movement. No more bleeding was noted after wiping.   Pt states he is also experiencing pain right below his left scapula. He was seen for this in the office on 01/27.  He now rates this pain as a 7 on a scale of 1-10. He states this is an intermittent pain, but when irritated it stays constant at a 7 out of 10. He notes swelling in his shoulder area. He has been using flexeril, naproxen, and tylenol #3 for this. Once he ran out of flexeril he started using tramadol from a prior injury. He states this gives him some relief at night, but no relief during the day. He sits down for 8-12 hours for work, and seems to irritate the pain in his shoulder area.     Review of Systems     Objective:   Physical Exam  Constitutional: He is oriented to person, place, and time. He appears well-developed and well-nourished. No distress.  HENT:  Head: Normocephalic.  Eyes: EOM are normal. No scleral icterus.  Neck: Normal range of motion.  Pulmonary/Chest: Effort normal and breath sounds normal.  Genitourinary: Rectal exam shows fissure and tenderness. Rectal exam shows no external hemorrhoid, no internal hemorrhoid, no mass and anal tone normal. Prostate is not enlarged and not tender.     Musculoskeletal: He exhibits tenderness.  Neurological: He is alert and oriented to person, place, and time. He has normal reflexes. He exhibits normal muscle tone. Coordination normal.  Skin: No rash noted.  Psychiatric: He has a normal mood and affect.          Assessment & Plan:  Anal  fissure/Cardizem-lidocain tid/Constipation care Thoracic strain recurrent secondary to old injury/RF meds

## 2014-02-06 ENCOUNTER — Other Ambulatory Visit: Payer: Self-pay | Admitting: Internal Medicine

## 2014-02-10 NOTE — Telephone Encounter (Signed)
RTC

## 2014-03-20 ENCOUNTER — Ambulatory Visit (INDEPENDENT_AMBULATORY_CARE_PROVIDER_SITE_OTHER): Payer: BLUE CROSS/BLUE SHIELD | Admitting: Emergency Medicine

## 2014-03-20 VITALS — BP 150/82 | HR 75 | Temp 98.6°F | Resp 18 | Wt 160.0 lb

## 2014-03-20 DIAGNOSIS — J209 Acute bronchitis, unspecified: Secondary | ICD-10-CM

## 2014-03-20 DIAGNOSIS — J018 Other acute sinusitis: Secondary | ICD-10-CM

## 2014-03-20 MED ORDER — AMOXICILLIN-POT CLAVULANATE 875-125 MG PO TABS: 1.0000 | ORAL_TABLET | Freq: Two times a day (BID) | ORAL | Status: DC

## 2014-03-20 MED ORDER — PSEUDOEPHEDRINE-GUAIFENESIN ER 60-600 MG PO TB12: 1.0000 | ORAL_TABLET | Freq: Two times a day (BID) | ORAL | Status: DC

## 2014-03-20 MED ORDER — PROMETHAZINE-CODEINE 6.25-10 MG/5ML PO SYRP: 5.0000 mL | ORAL_SOLUTION | Freq: Four times a day (QID) | ORAL | Status: DC | PRN

## 2014-03-20 NOTE — Progress Notes (Signed)
[facility] [address], [place] [address] [phone]  Date:  03/20/2014   Name:  Willie Farmer   DOB:  05/23/1976   MRN:  [id number]  PCP:  No PCP Per Patient    Chief Complaint: Sore Throat, Fever and Chills   History of Present Illness:  Willie Farmer is a 38 y.o. very pleasant male patient who presents with the following:  Ill with sore throat and nasal congestion and post nasal drainage.  Febrile no chills.  No nausea or vomiting. No stool change.  Non productive cough.  No wheezing or shortness of breath.  Wife has similar symptoms.  No improvement with over the counter medications or other home remedies. Denies other complaint or health concern today.   There are no active problems to display for this patient.   Past Medical History  Diagnosis Date  . Meningitis, viral 1995  . Acid reflux     Past Surgical History  Procedure Laterality Date  . Wisdom tooth extraction      History  Substance Use Topics  . Smoking status: Never Smoker   . Smokeless tobacco: Never Used  . Alcohol Use: 0.6 oz/week    1 Cans of beer per week     Comment: stopped drinking daily, may drink once every 3 weeks    Family History  Problem Relation Age of Onset  . Prostate cancer Father   . Prostate cancer Paternal Grandfather   . Prostate cancer Paternal Uncle     No Known Allergies  Medication list has been reviewed and updated.  Current Outpatient Prescriptions on File Prior to Visit  Medication Sig Dispense Refill  . acetaminophen-codeine (TYLENOL #3) 300-30 MG per tablet Take 1-2 tablets by mouth every 4 (four) hours as needed.  30 tablet  0  . cyclobenzaprine (FLEXERIL) 10 MG tablet Take 1 tablet (10 mg total) by mouth 3 (three) times daily as needed for muscle spasms.  30 tablet  2  . loperamide (IMODIUM A-D) 2 MG tablet 2 now and one hourly prn diarrhea.  Max 8 tabs in 24 hours  30 tablet  0  . naproxen sodium (ANAPROX DS) 550 MG tablet Take 1 tablet  (550 mg total) by mouth 2 (two) times daily with a meal.  40 tablet  0  . ondansetron (ZOFRAN-ODT) 8 MG disintegrating tablet Take 1 tablet (8 mg total) by mouth every 8 (eight) hours as needed for nausea.  30 tablet  0   No current facility-administered medications on file prior to visit.    Review of Systems:  As per HPI, otherwise negative.    Physical Examination: Filed Vitals:   03/20/14 1734  BP: 150/82  Pulse: 75  Temp: 98.6 F (37 C)  Resp: 18   Filed Vitals:   03/20/14 1734  Weight: 160 lb (72.576 kg)   Body mass index is 21.7 kg/(m^2). Ideal Body Weight:    GEN: WDWN, NAD, Non-toxic, A & O x 3 HEENT: Atraumatic, Normocephalic. Neck supple. No masses, No LAD.  Posterior pharyngeal drainage Ears and Nose: No external deformity. CV: RRR, No M/G/R. No JVD. No thrill. No extra heart sounds. PULM: CTA B, no wheezes, crackles, rhonchi. No retractions. No resp. distress. No accessory muscle use. ABD: S, NT, ND, +BS. No rebound. No HSM. EXTR: No c/c/e NEURO Normal gait.  PSYCH: Normally interactive. Conversant. Not depressed or anxious appearing.  Calm demeanor.    Assessment and Plan: Sinusitis Pharyngitis augmentin mucinex  Phen c cod  Signed,  Phillips OdorJeffery Anderson, MD

## 2014-03-24 ENCOUNTER — Encounter: Payer: Self-pay | Admitting: Emergency Medicine

## 2014-03-24 ENCOUNTER — Telehealth: Payer: Self-pay

## 2014-03-24 NOTE — Telephone Encounter (Signed)
Pt requesting new oow note for May 8-11,returning on may l2   Best phone for pt is [phone]   Pt will pick up when ready!

## 2014-03-25 NOTE — Telephone Encounter (Signed)
Work note printed and put in pick up drawer.  LMVM advising patient.

## 2014-03-25 NOTE — Telephone Encounter (Signed)
Pease take care of it

## 2014-05-08 ENCOUNTER — Telehealth: Payer: Self-pay

## 2014-05-08 NOTE — Telephone Encounter (Signed)
Misty from [facility] called wondering when she would be received records that she requested for the patient. Returned call and left a message stating that we processed the release and mailed the records via US mail as there were too many pages to fax without backing up their fax machine.

## 2014-05-12 NOTE — Telephone Encounter (Signed)
Willie Farmer called from [facility] called again about records.  Advised her about note below

## 2014-05-26 ENCOUNTER — Telehealth: Payer: Self-pay | Admitting: Gastroenterology

## 2014-05-26 NOTE — Telephone Encounter (Signed)
Left message for patient to call back  

## 2014-05-26 NOTE — Telephone Encounter (Signed)
Patient with sharp abdominal pain and bloating.  [facility].  They diagnosed him with a kidney stone, but they do not feel this is responsible for his pain.  He will come in and see Willette ClusterPaula Guenther RNP next week.  [facility] with [facility] he will need to sign a release in the office next week when he comes in to get the records from them

## 2014-05-26 NOTE — Telephone Encounter (Signed)
Patient rescheduled to see Willette Clusteraula Guenther RNP on 06/02/14 2:00

## 2014-05-27 ENCOUNTER — Encounter: Payer: Self-pay | Admitting: *Deleted

## 2014-06-02 ENCOUNTER — Ambulatory Visit (INDEPENDENT_AMBULATORY_CARE_PROVIDER_SITE_OTHER): Payer: BC Managed Care – PPO | Admitting: Nurse Practitioner

## 2014-06-02 ENCOUNTER — Encounter: Payer: Self-pay | Admitting: Nurse Practitioner

## 2014-06-02 ENCOUNTER — Other Ambulatory Visit (INDEPENDENT_AMBULATORY_CARE_PROVIDER_SITE_OTHER): Payer: BC Managed Care – PPO

## 2014-06-02 VITALS — BP 100/60 | HR 70 | Ht 71.5 in | Wt 158.0 lb

## 2014-06-02 DIAGNOSIS — R109 Unspecified abdominal pain: Secondary | ICD-10-CM

## 2014-06-02 LAB — CBC WITH DIFFERENTIAL/PLATELET
Basophils Absolute: 0 10*3/uL (ref 0.0–0.1)
Basophils Relative: 0.4 % (ref 0.0–3.0)
EOS PCT: 1.3 % (ref 0.0–5.0)
Eosinophils Absolute: 0.1 10*3/uL (ref 0.0–0.7)
HCT: 40.2 % (ref 39.0–52.0)
HEMOGLOBIN: 13.3 g/dL (ref 13.0–17.0)
Lymphocytes Relative: 36 % (ref 12.0–46.0)
Lymphs Abs: 1.6 10*3/uL (ref 0.7–4.0)
MCHC: 33 g/dL (ref 30.0–36.0)
MCV: 84.1 fl (ref 78.0–100.0)
MONO ABS: 0.4 10*3/uL (ref 0.1–1.0)
MONOS PCT: 8.8 % (ref 3.0–12.0)
NEUTROS ABS: 2.4 10*3/uL (ref 1.4–7.7)
Neutrophils Relative %: 53.5 % (ref 43.0–77.0)
PLATELETS: 214 10*3/uL (ref 150.0–400.0)
RBC: 4.78 Mil/uL (ref 4.22–5.81)
RDW: 15.4 % (ref 11.5–15.5)
WBC: 4.4 10*3/uL (ref 4.0–10.5)

## 2014-06-02 NOTE — Patient Instructions (Signed)
Please go to the basement level to have your labs drawn.   We will call you with your lab results.

## 2014-06-02 NOTE — Progress Notes (Signed)
    HPI :  Patient is a 38 year old male evaluated in 2013 by Dr. Jarold MottoPatterson for epigastric pain, nausea and coffee-ground emesis in the setting of NSAID use. Upper endoscopy pursued and revealed nonbleeding antral erosions. Endoscopically he appeared to have Barrett's esophagus but biopsies revealed only chronic inflammation/reflux.  Patient presents with a 4 month history of intermittent bilateral lower quadrant abdominal pain. Pain is very localized to small area in RLQ and another small area in LLQ. Pain unrelated to defecation or eating. Unrelated to physical activity. Initially patient mentioned the only known trigger was work but later retracted that. Patient has also been having right flank pain for which he recently saw urology. Patient told he had a nonobstructing renal stone, likely not the source of any pain. Patient referred himself to our clinic. He began taking Nexium almost 2 weeks ago and feels better but is concerned about etiology of the pain.   No other GI complaints. Patient had meningitis in high school which resulted in loss of memories of years preceding the illness. He talks about Divine intervention and shares some of his experiences involving visits from [employer].    Past Medical History  Diagnosis Date  . Meningitis, viral 1995  . Acid reflux   . Kidney stone 2015    Right side   Family History  Problem Relation Age of Onset  . Prostate cancer Father   . Prostate cancer Paternal Grandfather   . Prostate cancer Paternal Uncle   . Colon cancer Neg Hx    History  Substance Use Topics  . Smoking status: Never Smoker   . Smokeless tobacco: Never Used  . Alcohol Use: 0.6 oz/week    1 Cans of beer per week     Comment: stopped drinking daily, may drink once every 3 weeks   Current Outpatient Prescriptions  Medication Sig Dispense Refill  . esomeprazole (NEXIUM) 20 MG capsule Take 20 mg by mouth daily at 12 noon.      . ondansetron (ZOFRAN-ODT) 8 MG disintegrating  tablet Take 1 tablet (8 mg total) by mouth every 8 (eight) hours as needed for nausea.  30 tablet  0   No current facility-administered medications for this visit.   No Known Allergies   Review of Systems:  Physical Exam: BP 100/60  Pulse 70  Ht 5' 11.5" (1.816 m)  Wt 158 lb (71.668 kg)  BMI 21.73 kg/m2 Constitutional: Pleasant, thin black male in no acute distress. HEENT: Normocephalic and atraumatic. Conjunctivae are normal. No scleral icterus. Neck supple.  Cardiovascular: Normal rate, regular rhythm.  Pulmonary/chest: Effort normal and breath sounds normal. No wheezing, rales or rhonchi. Abdominal: Soft, nondistended, nontender. Bowel sounds active throughout. There are no masses palpable. No hepatomegaly. Extremities: no edema Lymphadenopathy: No cervical adenopathy noted. Neurological: Alert and oriented to person place and time. Skin: Skin is warm and dry. No rashes noted. Psychiatric: Normal mood and affect. Behavior is normal.   ASSESSMENT AND PLAN:  38 year old male with a 4 month history of localized BLQ abdominal pain unrelated to meals, defecation or physical activity. Etiology unclear, especially since pain in two separate areas. Exam is benign, weight stable, no bowel changes but patient is clearly worried about the cause of lower abdominal pain. We discussed abdominal imaging if pain doesn't improve. He will call in a few days with a condition update.

## 2014-06-03 ENCOUNTER — Encounter: Payer: Self-pay | Admitting: Nurse Practitioner

## 2014-06-03 DIAGNOSIS — R109 Unspecified abdominal pain: Secondary | ICD-10-CM | POA: Insufficient documentation

## 2014-06-03 LAB — COMPREHENSIVE METABOLIC PANEL
ALK PHOS: 42 U/L (ref 39–117)
ALT: 19 U/L (ref 0–53)
AST: 26 U/L (ref 0–37)
Albumin: 4.6 g/dL (ref 3.5–5.2)
BILIRUBIN TOTAL: 0.5 mg/dL (ref 0.2–1.2)
BUN: 15 mg/dL (ref 6–23)
CO2: 33 meq/L — AB (ref 19–32)
CREATININE: 1.4 mg/dL (ref 0.4–1.5)
Calcium: 9.6 mg/dL (ref 8.4–10.5)
Chloride: 107 mEq/L (ref 96–112)
GFR: 71.29 mL/min (ref >60.00)
GLUCOSE: 89 mg/dL (ref 70–99)
Potassium: 4.2 mEq/L (ref 3.5–5.1)
Sodium: 140 mEq/L (ref 135–145)
Total Protein: 7.8 g/dL (ref 6.0–8.3)

## 2014-06-03 LAB — HIGH SENSITIVITY CRP: CRP, High Sensitivity: <0.5 mg/L (ref 0.000–5.000)

## 2014-06-03 NOTE — Progress Notes (Signed)
Reviewed and agree with management.  May consider a trial of hyomax.  If not improved then I would obtain a CT of the abdomen and pelvis Barbette Hairobert D. Arlyce DiceKaplan, M.D., [facility]

## 2014-06-04 ENCOUNTER — Other Ambulatory Visit: Payer: Self-pay

## 2014-06-04 ENCOUNTER — Other Ambulatory Visit: Payer: Self-pay | Admitting: *Deleted

## 2014-06-04 ENCOUNTER — Telehealth: Payer: Self-pay | Admitting: Physician Assistant

## 2014-06-04 DIAGNOSIS — R109 Unspecified abdominal pain: Secondary | ICD-10-CM

## 2014-06-04 NOTE — Telephone Encounter (Signed)
Patient is calling for lab results.

## 2014-06-04 NOTE — Telephone Encounter (Signed)
Sent to me by mistake 

## 2014-06-06 ENCOUNTER — Ambulatory Visit (INDEPENDENT_AMBULATORY_CARE_PROVIDER_SITE_OTHER)
Admission: RE | Admit: 2014-06-06 | Discharge: 2014-06-06 | Disposition: A | Discharge status: Home / Self Care | Payer: BC Managed Care – PPO | Source: Ambulatory Visit | Attending: Nurse Practitioner | Admitting: Nurse Practitioner

## 2014-06-06 ENCOUNTER — Ambulatory Visit: Payer: BLUE CROSS/BLUE SHIELD | Admitting: Physician Assistant

## 2014-06-06 ENCOUNTER — Telehealth: Payer: Self-pay | Admitting: Nurse Practitioner

## 2014-06-06 DIAGNOSIS — R109 Unspecified abdominal pain: Secondary | ICD-10-CM

## 2014-06-06 MED ORDER — IOHEXOL 300 MG/ML  SOLN
100.0000 mL | Freq: Once | INTRAMUSCULAR | Status: AC | PRN
  Administered 2014-06-06: 100 mL via INTRAVENOUS

## 2014-06-06 NOTE — Telephone Encounter (Signed)
Spoke with patient and gave him recommendations from previous radiology results.

## 2016-04-11 DIAGNOSIS — J069 Acute upper respiratory infection, unspecified: Secondary | ICD-10-CM | POA: Diagnosis not present

## 2016-04-11 DIAGNOSIS — R05 Cough: Secondary | ICD-10-CM | POA: Diagnosis not present

## 2016-04-25 ENCOUNTER — Ambulatory Visit: Payer: Self-pay | Admitting: Internal Medicine

## 2016-07-13 ENCOUNTER — Ambulatory Visit (INDEPENDENT_AMBULATORY_CARE_PROVIDER_SITE_OTHER): Payer: BLUE CROSS/BLUE SHIELD | Admitting: Primary Care

## 2016-07-13 ENCOUNTER — Encounter: Payer: Self-pay | Admitting: Primary Care

## 2016-07-13 VITALS — BP 114/70 | HR 72 | Temp 98.2°F | Ht 72.0 in | Wt 159.8 lb

## 2016-07-13 DIAGNOSIS — Z0000 Encounter for general adult medical examination without abnormal findings: Secondary | ICD-10-CM | POA: Insufficient documentation

## 2016-07-13 DIAGNOSIS — Z8042 Family history of malignant neoplasm of prostate: Secondary | ICD-10-CM

## 2016-07-13 NOTE — Patient Instructions (Addendum)
It is important that you improve your diet. Please limit fast food, fried foods, junk food, sugary drinks, etc. Increase your consumption of fresh fruits and vegetables, whole grains, lean protein.  Ensure you are consuming 64 ounces of water daily.  Start exercising. You should be getting 1 hour of moderate intensity exercise 3-5 days weekly.  Schedule a lab only appointment at your earliest convenience. Ensure you come fasting for 4 hours prior to this appointment. You may have water and black coffee only.  Follow up in 1 year for repeat physical or sooner if needed.  It was a pleasure to meet you today! Please don't hesitate to call me with any questions. [facility]!

## 2016-07-13 NOTE — Assessment & Plan Note (Signed)
Immunizations UTD. Will return for Flu shot. Fair diet, does not exercise. Recommendations provided regarding improvements in diet. Start exercising. Exam unremarkable. Labs pending. Follow up in 1 year for repeat physical.

## 2016-07-13 NOTE — Progress Notes (Signed)
Subjective:    Patient ID: Willie Farmer, male    DOB: 08/30/1976, 40 y.o.   MRN: [id number]  HPI  Willie Farmer is a 40 year old male who presents today to establish care and for complete physical. His last physical was several years ago.   Immunizations: -Tetanus: Completed 2-3 years ago. -Influenza: Did not complete last year.  Diet: He endorses a fair diet. Breakfast: Waffle, fruit Lunch: Chicken tenders, fried food Dinner: Potatoes, vegetable, meat, restaurants Snacks: None Desserts: None Beverages: Water, Pepsi (1-2 cans daily),   Exercise: He does not currently exercise but is active. Eye exam: Completed 3 years ago. Dental exam: Has not completed in the past several years.    Review of Systems  Constitutional: Negative for unexpected weight change.  HENT: Negative for rhinorrhea.   Respiratory: Negative for cough and shortness of breath.   Cardiovascular: Negative for chest pain.  Gastrointestinal: Negative for constipation and diarrhea.  Genitourinary: Negative for difficulty urinating.  Musculoskeletal: Negative for arthralgias and myalgias.  Skin: Negative for rash.  Allergic/Immunologic: Negative for environmental allergies.  Neurological: Negative for dizziness, numbness and headaches.  Psychiatric/Behavioral:       Denies concerns for anxiety and depression.        Past Medical History:  Diagnosis Date  . Acid reflux   . Chicken pox   . Kidney stone 2015   Right side  . Lead poisoning   . Meningitis, viral 1995     Social History   Social History  . Marital status: Married    Spouse name: N/A  . Number of children: 1  . 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 Smoking status: Never Smoker  . Smokeless tobacco: Never Used  . Alcohol use 0.6 oz/week    1 Cans of beer per week     Comment: stopped drinking daily, may drink once every 3 weeks  . Drug use: No  .  Sexual activity: Yes   Other Topics Concern  . Not on file   Social History Narrative  . No narrative on file    Past Surgical History:  Procedure Laterality Date  . ESOPHAGOGASTRODUODENOSCOPY  2013  . WISDOM TOOTH EXTRACTION      Family History  Problem Relation Age of Onset  . Prostate cancer Father   . Prostate cancer Paternal Grandfather   . Prostate cancer Paternal Uncle   . Colon cancer Neg Hx     No Known Allergies  No current outpatient prescriptions on file prior to visit.   No current facility-administered medications on file prior to visit.     BP 114/70   Pulse 72   Temp 98.2 F (36.8 C) (Oral)   Ht 6' (1.829 m)   Wt 159 lb 12.8 oz (72.5 kg)   SpO2 98%   BMI 21.67 kg/m    Objective:   Physical Exam  Constitutional: He is oriented to person, place, and time. He appears well-nourished.  HENT:  Right Ear: Tympanic membrane and ear canal normal.  Left Ear: Tympanic membrane and ear canal normal.  Nose: Nose normal. Right sinus exhibits no maxillary sinus tenderness and no frontal sinus tenderness. Left sinus exhibits no maxillary sinus tenderness and no frontal sinus tenderness.  Mouth/Throat: Oropharynx is clear and moist.  Eyes: Conjunctivae and EOM are normal. Pupils are equal, round, and reactive to light.  Neck: Neck supple. Carotid bruit is  not present. No thyromegaly present.  Cardiovascular: Normal rate, regular rhythm and normal heart sounds.   Pulmonary/Chest: Effort normal and breath sounds normal. He has no wheezes. He has no rales.  Abdominal: Soft. Bowel sounds are normal. There is no tenderness.  Musculoskeletal: Normal range of motion.  Neurological: He is alert and oriented to person, place, and time. He has normal reflexes. No cranial nerve deficit.  Skin: Skin is warm and dry.  Psychiatric: He has a normal mood and affect.          Assessment & Plan:

## 2016-07-13 NOTE — Progress Notes (Signed)
Pre visit review using our clinic review tool, if applicable. No additional management support is needed unless otherwise documented below in the visit note. 

## 2016-07-13 NOTE — Assessment & Plan Note (Signed)
No prior PSA screening, will obtain baseline today. No alarm signs.

## 2016-07-14 ENCOUNTER — Encounter: Payer: Self-pay | Admitting: *Deleted

## 2016-07-14 ENCOUNTER — Other Ambulatory Visit (INDEPENDENT_AMBULATORY_CARE_PROVIDER_SITE_OTHER): Payer: BLUE CROSS/BLUE SHIELD

## 2016-07-14 ENCOUNTER — Encounter (INDEPENDENT_AMBULATORY_CARE_PROVIDER_SITE_OTHER): Payer: Self-pay

## 2016-07-14 DIAGNOSIS — Z8042 Family history of malignant neoplasm of prostate: Secondary | ICD-10-CM

## 2016-07-14 DIAGNOSIS — Z0000 Encounter for general adult medical examination without abnormal findings: Secondary | ICD-10-CM

## 2016-07-14 LAB — COMPREHENSIVE METABOLIC PANEL
ALK PHOS: 42 U/L (ref 39–117)
ALT: 10 U/L (ref 0–53)
AST: 18 U/L (ref 0–37)
Albumin: 4.6 g/dL (ref 3.5–5.2)
BUN: 13 mg/dL (ref 6–23)
CALCIUM: 9.5 mg/dL (ref 8.4–10.5)
CO2: 31 mEq/L (ref 19–32)
CREATININE: 1.28 mg/dL (ref 0.40–1.50)
Chloride: 106 mEq/L (ref 96–112)
GFR: 80.12 mL/min (ref >60.00)
Glucose, Bld: 93 mg/dL (ref 70–99)
Potassium: 4.5 mEq/L (ref 3.5–5.1)
Sodium: 140 mEq/L (ref 135–145)
TOTAL PROTEIN: 7.3 g/dL (ref 6.0–8.3)
Total Bilirubin: 0.5 mg/dL (ref 0.2–1.2)

## 2016-07-14 LAB — LIPID PANEL
Cholesterol: 172 mg/dL (ref 0–200)
HDL: 39.7 mg/dL (ref >39.00)
LDL Cholesterol: 122 mg/dL — ABNORMAL HIGH (ref 0–99)
NonHDL: 132.17
Total CHOL/HDL Ratio: 4
Triglycerides: 53 mg/dL (ref 0.0–149.0)
VLDL: 10.6 mg/dL (ref 0.0–40.0)

## 2016-07-14 LAB — PSA: PSA: 0.6 ng/mL (ref 0.10–4.00)

## 2016-09-19 DIAGNOSIS — Z23 Encounter for immunization: Secondary | ICD-10-CM | POA: Diagnosis not present

## 2016-09-25 DIAGNOSIS — J019 Acute sinusitis, unspecified: Secondary | ICD-10-CM | POA: Diagnosis not present

## 2017-03-02 ENCOUNTER — Encounter: Payer: Self-pay | Admitting: Primary Care

## 2017-03-02 ENCOUNTER — Ambulatory Visit (INDEPENDENT_AMBULATORY_CARE_PROVIDER_SITE_OTHER): Payer: BLUE CROSS/BLUE SHIELD | Admitting: Primary Care

## 2017-03-02 VITALS — BP 112/66 | HR 66 | Temp 98.1°F | Ht 71.5 in | Wt 165.8 lb

## 2017-03-02 DIAGNOSIS — J302 Other seasonal allergic rhinitis: Secondary | ICD-10-CM | POA: Diagnosis not present

## 2017-03-02 MED ORDER — FLUTICASONE PROPIONATE 50 MCG/ACT NA SUSP: 1.0000 | Freq: Two times a day (BID) | NASAL | 0 refills | Status: DC

## 2017-03-02 MED ORDER — CETIRIZINE HCL 10 MG PO TABS: 10.0000 mg | ORAL_TABLET | Freq: Every day | ORAL | 3 refills | Status: DC

## 2017-03-02 NOTE — Assessment & Plan Note (Signed)
Symptoms today resemble seasonal allergic rhinitis. Exam today not suggestive of viral or bacterial URI. Rx for Zyrtec and Flonase sent to pharmacy. Return precautions provided.

## 2017-03-02 NOTE — Progress Notes (Signed)
Pre visit review using our clinic review tool, if applicable. No additional management support is needed unless otherwise documented below in the visit note. 

## 2017-03-02 NOTE — Progress Notes (Signed)
   Subjective:    Patient ID: Willie Farmer, male    DOB: 12-28-1975, 41 y.o.   MRN: [id number]  HPI  Willie Farmer is a 41 year old male who presents today with a chief complaint of hoarse voice. He reports sinus pressure, nasal congestion, sore throat, post nasal drip, mild cough. His symptoms began 2 days ago. He's been gargling salt water with improvement in sore throat. He's also taken an OTC "decongestant" and Zyrtec . He denies fevers.   Review of Systems  Constitutional: Positive for chills. Negative for fatigue and fever.  HENT: Positive for congestion, ear pain, postnasal drip, rhinorrhea, sinus pressure and sore throat.   Respiratory: Positive for cough. Negative for shortness of breath.   Cardiovascular: Negative for chest pain.  Allergic/Immunologic: Positive for environmental allergies.       Past Medical History:  Diagnosis Date  . Acid reflux   . Chicken pox   . Kidney stone 2015   Right side  . Lead poisoning   . Meningitis, viral 1995     Social History   Social History  . Marital status: Married    Spouse name: N/A  . Number of children: 1  . 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 Smoking status: Never Smoker  . Smokeless tobacco: Never Used  . Alcohol use 0.6 oz/week    1 Cans of beer per week     Comment: stopped drinking daily, may drink once every 3 weeks  . Drug use: No  . Sexual activity: Yes   Other Topics Concern  . Not on file   Social History Narrative  . No narrative on file    Past Surgical History:  Procedure Laterality Date  . ESOPHAGOGASTRODUODENOSCOPY  2013  . WISDOM TOOTH EXTRACTION      Family History  Problem Relation Age of Onset  . Prostate cancer Father   . Prostate cancer Paternal Grandfather   . Prostate cancer Paternal Uncle   . Colon cancer Neg Hx     No Known Allergies  No current outpatient prescriptions on file prior to visit.     No current facility-administered medications on file prior to visit.     BP 112/66   Pulse 66   Temp 98.1 F (36.7 C) (Oral)   Ht 5' 11.5" (1.816 m)   Wt 165 lb 12.8 oz (75.2 kg)   SpO2 98%   BMI 22.80 kg/m    Objective:   Physical Exam  Constitutional: He appears well-nourished.  HENT:  Right Ear: Tympanic membrane and ear canal normal.  Left Ear: Tympanic membrane and ear canal normal.  Nose: No mucosal edema. Right sinus exhibits no maxillary sinus tenderness and no frontal sinus tenderness. Left sinus exhibits no maxillary sinus tenderness and no frontal sinus tenderness.  Mouth/Throat: Oropharynx is clear and moist.  Hoarse voice  Eyes: Conjunctivae are normal.  Neck: Neck supple.  Cardiovascular: Normal rate and regular rhythm.   Pulmonary/Chest: Effort normal and breath sounds normal. He has no wheezes. He has no rales.  Skin: Skin is warm and dry.          Assessment & Plan:

## 2017-03-02 NOTE — Patient Instructions (Addendum)
Your symptoms are indicative of allergies.  Continue Zyrtec tablets every night at bedtime. Take these until pollen season is over.  Nasal Congestion/Ear Pressure/Sinus Pressure: Try using Flonase (fluticasone) nasal spray. Instill 1 spray in each nostril twice daily.   It was a pleasure to see you today!

## 2017-03-11 DIAGNOSIS — J019 Acute sinusitis, unspecified: Secondary | ICD-10-CM | POA: Diagnosis not present

## 2017-03-11 DIAGNOSIS — R42 Dizziness and giddiness: Secondary | ICD-10-CM | POA: Diagnosis not present

## 2017-03-13 ENCOUNTER — Telehealth: Payer: Self-pay | Admitting: Internal Medicine

## 2017-03-13 ENCOUNTER — Encounter: Payer: Self-pay | Admitting: Internal Medicine

## 2017-03-13 ENCOUNTER — Ambulatory Visit (INDEPENDENT_AMBULATORY_CARE_PROVIDER_SITE_OTHER): Payer: BLUE CROSS/BLUE SHIELD | Admitting: Internal Medicine

## 2017-03-13 VITALS — BP 116/78 | HR 64 | Temp 98.0°F | Wt 169.0 lb

## 2017-03-13 DIAGNOSIS — R42 Dizziness and giddiness: Secondary | ICD-10-CM

## 2017-03-13 DIAGNOSIS — J019 Acute sinusitis, unspecified: Secondary | ICD-10-CM

## 2017-03-13 MED ORDER — AMOXICILLIN-POT CLAVULANATE 875-125 MG PO TABS: 1.0000 | ORAL_TABLET | Freq: Two times a day (BID) | ORAL | 0 refills | Status: DC

## 2017-03-13 NOTE — Telephone Encounter (Signed)
Patient Name: Willie Farmer  DOB: [birth year]    Initial Comment Caller states saw MD 2 wks ago; voice became better; professor said he didn't look right; Fri evening didn't feel well; room turned like in matrix; when woke up could not walk straight; [facility], dx w/vertigo and acute sinusitis; LT ear looked swollen; gave meds; vomited last night; today still spinning and not walking right w/o meds;    Nurse Assessment  Nurse: Stefano Gaul, RN, Dwana Curd Date/Time (Eastern Time): 03/13/2017 8:29:37 AM  Confirm and document reason for call. If symptomatic, describe symptoms. ---Caller states he was in the office 2 weeks ago and was diagnosed with allergies. He was given zyrtec and flonase and his voice is better. Friday, the room started spinning. Went to the doctor on Saturday and was diagnosed with sinusitis and vertigo. He was prescribed meclizine and prednisone. Still having vertigo without meclizine and ringing in his right ear. He vomited last night.  Does the patient have any new or worsening symptoms? ---Yes  Will a triage be completed? ---Yes  Related visit to physician within the last 2 weeks? ---Yes  Does the PT have any chronic conditions? (i.e. diabetes, asthma, etc.) ---No  Is this a behavioral health or substance abuse call? ---No     Guidelines    Guideline Title Affirmed Question Affirmed Notes  Dizziness - Vertigo [1] MODERATE dizziness (e.g., vertigo; feels very unsteady, interferes with normal activities) AND [2] has been evaluated by physician for this    Final Disposition User   See PCP When Office is Open (within 3 days) Stefano Gaul, RN, Dwana Curd    Comments  appt scheduled for 03/13/2017 at 10:30 am with Nicki Reaper   Disagree/Comply: Comply

## 2017-03-13 NOTE — Telephone Encounter (Signed)
Pt has appt with R Baity NP 03/13/17 at 10:30.

## 2017-03-13 NOTE — Progress Notes (Signed)
Subjective:    Patient ID: Willie Farmer, male    DOB: 03/10/1976, 41 y.o.   MRN: [id number]  HPI  Pt presents to the clinic today with c/o dizziness. This started 3-4 days ago. He describes it as a sensation that the room is spinning. He is having difficulty walking. This is associated with hiccups and 1 episode of vomiting. He reports he was seen 4/19 by Mayra Reel, NP. Diagnosed with allergies at that time and advised to start Zyrtec and Flonase OTC. He started feeling very dizzy 4/27. Went to [facility]. They diagnosed him with sinusitis and vertigo, prescribed him Prednisone and Meclizine. He reports he continues to feel dizzy with some improvement in symptoms. He denies headache or visual changes. The dizziness does seem worse with position and head changes. He also reports nasal congestion, facial pain and pressure but denies fever, chills or body aches. He has tried Web designer with some relief of his symptoms.   Review of Systems  Past Medical History:  Diagnosis Date  . Acid reflux   . Chicken pox   . Kidney stone 2015   Right side  . Lead poisoning   . Meningitis, viral 1995    Current Outpatient Prescriptions  Medication Sig Dispense Refill  . cetirizine (ZYRTEC) 10 MG tablet Take 1 tablet (10 mg total) by mouth daily. 30 tablet 3  . fluticasone (FLONASE) 50 MCG/ACT nasal spray Place 1 spray into both nostrils 2 (two) times daily. 16 g 0   No current facility-administered medications for this visit.     No Known Allergies  Family History  Problem Relation Age of Onset  . Prostate cancer Father   . Prostate cancer Paternal Grandfather   . Prostate cancer Paternal Uncle   . Colon cancer Neg Hx     Social History   Social History  . Marital status: Married    Spouse name: N/A  . Number of children: 1  . 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 Smoking status: Never  Smoker  . Smokeless tobacco: Never Used  . Alcohol use 0.6 oz/week    1 Cans of beer per week     Comment: stopped drinking daily, may drink once every 3 weeks  . Drug use: No  . Sexual activity: Yes   Other Topics Concern  . Not on file   Social History Narrative  . No narrative on file     Constitutional: Denies fever, malaise, fatigue, headache or abrupt weight changes.  HEENT: Pt reports nasal congestion. Denies eye pain, eye redness, ear pain, ringing in the ears, wax buildup, runny nose, bloody nose, or sore throat. Respiratory: Denies difficulty breathing, shortness of breath, cough or sputum production.   Cardiovascular: Denies chest pain, chest tightness, palpitations or swelling in the hands or feet.  Gastrointestinal: Pt reports vomiting. Denies abdominal pain, bloating, constipation, diarrhea or blood in the stool.  Neurological: Pt reports dizziness. Denies difficulty with memory, difficulty with speech.    No other specific complaints in a complete review of systems (except as listed in HPI above).     Objective:   Physical Exam    BP 116/78   Pulse 64   Temp 98 F (36.7 C) (Oral)   Wt 169 lb (76.7 kg)   SpO2 98%   BMI 23.24 kg/m  Wt Readings from Last 3 Encounters:  03/13/17 169  lb (76.7 kg)  03/02/17 165 lb 12.8 oz (75.2 kg)  07/13/16 159 lb 12.8 oz (72.5 kg)    General: Appears his stated age,  in NAD. HEENT: Head: normal shape and size, no sinus tenderness noted; Eyes: PERRLA and EOMs intact, no nystagmus; Ears: Tm's gray and intact, normal light reflex; Throat/Mouth: Teeth present, mucosa pink and moist, + PND, no exudate, lesions or ulcerations noted.  Neck:  No adenopathy noted. Cardiovascular: Normal rate and rhythm. S1,S2 noted.  No murmur, rubs or gallops noted. Pulmonary/Chest: Normal effort and positive vesicular breath sounds. No respiratory distress. No wheezes, rales or ronchi noted.  Musculoskeletal: He is able to tandem walk, walk on  heels and walk on toes. Neurological: Alert and oriented. Coordination normal.    BMET    Component Value Date/Time   NA 140 07/14/2016 0935   K 4.5 07/14/2016 0935   CL 106 07/14/2016 0935   CO2 31 07/14/2016 0935   GLUCOSE 93 07/14/2016 0935   BUN 13 07/14/2016 0935   CREATININE 1.28 07/14/2016 0935   CREATININE 1.08 07/01/2013 0918   CALCIUM 9.5 07/14/2016 0935    Lipid Panel     Component Value Date/Time   CHOL 172 07/14/2016 0935   TRIG 53.0 07/14/2016 0935   HDL 39.70 07/14/2016 0935   CHOLHDL 4 07/14/2016 0935   VLDL 10.6 07/14/2016 0935   LDLCALC 122 (H) 07/14/2016 0935    CBC    Component Value Date/Time   WBC 4.4 06/02/2014 1558   RBC 4.78 06/02/2014 1558   HGB 13.3 06/02/2014 1558   HCT 40.2 06/02/2014 1558   PLT 214.0 06/02/2014 1558   MCV 84.1 06/02/2014 1558   MCV 88.8 12/24/2013 1046   MCH 27.3 12/24/2013 1046   MCHC 33.0 06/02/2014 1558   RDW 15.4 06/02/2014 1558   LYMPHSABS 1.6 06/02/2014 1558   MONOABS 0.4 06/02/2014 1558   EOSABS 0.1 06/02/2014 1558   BASOSABS 0.0 06/02/2014 1558    Hgb A1C No results found for: HGBA1C        Assessment & Plan:   Dizziness, Sinusitis:  He feels like this is related to a head cold He reports some improvement Continue Prednisone and Meclizine eRx for Augmentin BID x 10 days  Return precautions discussed Nicki Reaper, NP

## 2017-03-13 NOTE — Patient Instructions (Signed)

## 2017-03-14 ENCOUNTER — Encounter (HOSPITAL_COMMUNITY): Payer: Self-pay

## 2017-03-14 DIAGNOSIS — H8110 Benign paroxysmal vertigo, unspecified ear: Secondary | ICD-10-CM | POA: Diagnosis not present

## 2017-03-14 DIAGNOSIS — R51 Headache: Secondary | ICD-10-CM | POA: Diagnosis present

## 2017-03-14 DIAGNOSIS — Z79899 Other long term (current) drug therapy: Secondary | ICD-10-CM | POA: Insufficient documentation

## 2017-03-14 NOTE — ED Triage Notes (Signed)
Pt is being treated for a sinus infection, but today complains of pain in his neck and head, he also states that he has the hiccups for the last three days

## 2017-03-15 ENCOUNTER — Emergency Department (HOSPITAL_COMMUNITY)
Admission: EM | Admit: 2017-03-15 | Discharge: 2017-03-15 | Disposition: A | Discharge status: Home / Self Care | Payer: BLUE CROSS/BLUE SHIELD | Attending: Emergency Medicine | Admitting: Emergency Medicine

## 2017-03-15 ENCOUNTER — Telehealth: Payer: Self-pay

## 2017-03-15 DIAGNOSIS — H8110 Benign paroxysmal vertigo, unspecified ear: Secondary | ICD-10-CM

## 2017-03-15 NOTE — Telephone Encounter (Signed)
Noted  

## 2017-03-15 NOTE — ED Notes (Signed)
Patient is alert and oriented x3.  He was given DC instructions and follow up visit instructions.  Patient gave verbal understanding.  He was DC ambulatory under his own power to home.  V/S stable.  He was not showing any signs of distress on DC 

## 2017-03-15 NOTE — Telephone Encounter (Signed)
Per chart review tab pt went to [facility] ED. 

## 2017-03-15 NOTE — Telephone Encounter (Signed)
Pt saw R Baity NP on 03/13/17 and then was seen at [facility] ED;pt has 3 more prednisone to take but pt thinks he needs to start on abx. Mayra Reel NP advised pt to take prednisone as prescribed and he may also take the abx. Pt wanted to know if could take 2 of the abx tonight. I advised pt no; take as directed which would be 1 tab po twice a day. Pt voiced understanding and will cb with update. FYI to Mayra Reel NP.

## 2017-03-15 NOTE — Telephone Encounter (Signed)
PLEASE NOTE: All timestamps contained within this report are represented as [place] Standard Time. CONFIDENTIALTY NOTICE: This fax transmission is intended only for the addressee. It contains information that is legally privileged, confidential or otherwise protected from use or disclosure. If you are not the intended recipient, you are strictly prohibited from reviewing, disclosing, copying using or disseminating any of this information or taking any action in reliance on or regarding this information. If you have received this fax in error, please notify us immediately by telephone so that we can arrange for its return to [place]. Phone: [phone], Toll-Free: [phone], Fax: [phone] Page: 1 of 2 Call Id: [id number] [facility] Night - Client TELEPHONE ADVICE RECORD [facility] Patient Name: Willie Farmer Gender: Male DOB: Feb 28, 1976 Age: 41 Y 5 M 25 D Return Phone Number: [phone] (Primary) City/State/Zip: [place] Client [facility] Night - Client Client Site [facility] [place] - Night Physician Nicki Reaper - NP Who Is Calling Patient / Member / Family / Caregiver Call Type Triage / Clinical Relationship To Patient Self Return Phone Number [phone] (Primary) Chief Complaint Headache Reason for Call Symptomatic / Request for Health Information Initial Comment Caller states he has a headache and pain in the back right side of his ear. Yesterday he had a follow up apt for vertigo Nurse Assessment Nurse: [name], RN, [name] Date/Time (Eastern Time): 03/14/2017 5:05:18 PM Confirm and document reason for call. If symptomatic, describe symptoms. ---Caller states he has pain behind his right ear that "slightly" shoots up to the top of his head that started about 10 minutes ago. He says "it feels like a head cold". He recently had a "buzzing" noise that is resolving. The pain he is experiencing is a 1 or  2 out of 10. Denies other symptoms besides his hiccupping which started several days ago and was discussed at his f/u office visit for vertigo yesterday. Does the PT have any chronic conditions? (i.e. diabetes, asthma, etc.) ---Yes List chronic conditions. ---vertigo, Guidelines Guideline Title Affirmed Question Confusion - Delirium Headache or vomiting Disp. Time Lamount Cohen Time) Disposition Final User 03/14/2017 5:23:38 PM Go to ED Now Yes McNew, RN, [name] Referrals GO TO FACILITY UNDECIDED Care Advice Given Per Guideline GO TO ED NOW: You need to be seen in the Emergency Department. Go to the ER at ___________ Hospital. Leave now. Drive carefully. NOTE TO TRIAGER - DRIVING: * Another adult should drive. CARE ADVICE given per Confusion-Delirium (Adult) guideline. Comments User: Thurnell Garbe Date/Time Lamount Cohen Time): 03/14/2017 5:27:46 PM PLEASE NOTE: All timestamps contained within this report are represented as [place] Standard Time. CONFIDENTIALTY NOTICE: This fax transmission is intended only for the addressee. It contains information that is legally privileged, confidential or otherwise protected from use or disclosure. If you are not the intended recipient, you are strictly prohibited from reviewing, disclosing, copying using or disseminating any of this information or taking any action in reliance on or regarding this information. If you have received this fax in error, please notify us immediately by telephone so that we can arrange for its return to [place]. Phone: [phone], Toll-Free: [phone], Fax: [phone] Page: 2 of 2 Call Id: [id number] Comments Caller called back to give the nurse additional information, transferred to a nurse. User: Cristy Friedlander, RN Date/Time Lamount Cohen Time): 03/14/2017 5:32:56 PM The pt is going to [facility] ED.

## 2017-03-15 NOTE — Discharge Instructions (Signed)
Continue your course of prednisone. Take meclizine OR Cetirizine as prescribed by your doctor. Follow up with your primary care doctor to ensure resolution of symptoms.

## 2017-03-29 NOTE — ED Provider Notes (Signed)
[facility] DEPT Provider Note   CSN: [id number] Arrival date & time: 03/14/17  1844    History   Chief Complaint Chief Complaint  Patient presents with  . Headache    HPI Willie Farmer is a 41 y.o. male.  41 year old male presents to the emergency department for evaluation of dizziness. He reports onset of symptoms with associated nasal congestion. He has been treated for a sinus infection and prescribed a course of steroids. He believes that this has been helping his symptoms as his dizziness has been subsiding. Dizziness is aggravated with position change. Patient also noting a myriad of other symptoms including hiccups and lateral neck pain traveling to his occiput. He states that he is concerned that he is having a stroke. He denies headache, fever, syncope, near syncope, numbness, paresthesias, vision changes or vision loss, tinnitus, hearing loss. No personal or FHx of CVA.   The history is provided by the patient. No language interpreter was used.    Past Medical History:  Diagnosis Date  . Acid reflux   . Chicken pox   . Kidney stone 2015   Right side  . Lead poisoning   . Meningitis, viral 1995    Patient Active Problem List   Diagnosis Date Noted  . Seasonal allergic rhinitis 03/02/2017  . Family history of prostate cancer in father 07/13/2016  . Preventative health care 07/13/2016    Past Surgical History:  Procedure Laterality Date  . ESOPHAGOGASTRODUODENOSCOPY  2013  . WISDOM TOOTH EXTRACTION         Home Medications    Prior to Admission medications   Medication Sig Start Date End Date Taking? Authorizing Provider  amoxicillin-clavulanate (AUGMENTIN) 875-125 MG tablet Take 1 tablet by mouth 2 (two) times daily. 03/13/17   Jearld Fenton, NP  cetirizine (ZYRTEC) 10 MG tablet Take 1 tablet (10 mg total) by mouth daily. Patient not taking: Reported on 03/13/2017 03/02/17   Pleas Koch, NP  fluticasone [facility]) 50 MCG/ACT nasal spray Place 1  spray into both nostrils 2 (two) times daily. 03/02/17   Pleas Koch, NP  meclizine (ANTIVERT) 25 MG tablet Take 25 mg by mouth 3 times/day as needed-between meals & bedtime for dizziness.    [provider]  predniSONE (STERAPRED UNI-PAK 21 TAB) 10 MG (21) TBPK tablet Take by mouth daily.    [provider]    Family History Family History  Problem Relation Age of Onset  . Prostate cancer Father   . Prostate cancer Paternal Grandfather   . Prostate cancer Paternal Uncle   . Colon cancer Neg Hx     Social History Social History  Substance Use Topics  . Smoking status: Never Smoker  . Smokeless tobacco: Never Used  . Alcohol use 0.6 oz/week    1 Cans of beer per week     Comment: stopped drinking daily, may drink once every 3 weeks     Allergies   Patient has no known allergies.   Review of Systems Review of Systems Ten systems reviewed and are negative for acute change, except as noted in the HPI.    Physical Exam Updated Vital Signs BP (!) 136/91 (BP Location: Left Arm)   Pulse 69   Temp 98.2 F (36.8 C) (Oral)   Resp 18   Ht 6' 2"  (1.88 m)   Wt 75.8 kg   SpO2 100%   BMI 21.44 kg/m   Physical Exam  Constitutional: He is oriented to person, place, and  time. He appears well-developed and well-nourished. No distress.  Nontoxic appearing and in no acute distress  HENT:  Head: Normocephalic and atraumatic.  Oropharynx clear. Patient tolerating secretions without difficulty.  Eyes: Conjunctivae and EOM are normal. Pupils are equal, round, and reactive to light. No scleral icterus.  Neck: Normal range of motion.  No nuchal rigidity or meningismus  Cardiovascular: Normal rate, regular rhythm and intact distal pulses.   Pulmonary/Chest: Effort normal. No respiratory distress. He has no wheezes.  Respirations even and unlabored. Lungs clear to auscultation bilaterally.  Musculoskeletal: Normal range of motion.  Neurological: He is alert and  oriented to person, place, and time. No cranial nerve deficit. He exhibits normal muscle tone. Coordination normal.  GCS 15. Speech is goal oriented. No focal neurologic deficits appreciated. Patient ambulatory with steady gait.  Skin: Skin is warm and dry. No rash noted. He is not diaphoretic. No erythema. No pallor.  Psychiatric: He has a normal mood and affect. His behavior is normal.  Nursing note and vitals reviewed.    ED Treatments / Results  Labs (all labs ordered are listed, but only abnormal results are displayed) Labs Reviewed - No data to display  EKG  EKG Interpretation None       Radiology No results found.  Procedures Procedures (including critical care time)  Medications Ordered in ED Medications - No data to display   Initial Impression / Assessment and Plan / ED Course  I have reviewed the triage vital signs and the nursing notes.  Pertinent labs & imaging results that were available during my care of the patient were reviewed by me and considered in my medical decision making (see chart for details).     Patient with symptoms of benign paroxysmal positional vertigo. He has a nonfocal neurologic exam. He was diagnosed with a sinus infection by his primary care doctor. He has been on a course of steroids which has been improving his dizziness. Vital signs stable. I have counseled the patient on continued supportive management. I do not believe further emergent workup is indicated at this time. Reassurance provided. Patient discharged in stable condition.   Final Clinical Impressions(s) / ED Diagnoses   Final diagnoses:  Benign paroxysmal positional vertigo, unspecified laterality    New Prescriptions Discharge Medication List as of 03/15/2017  3:13 AM       Antonietta Breach, PA-C 04/06/17 0331    Molpus, Jenny Reichmann, MD 04/06/17 2956

## 2017-05-15 ENCOUNTER — Other Ambulatory Visit: Payer: Self-pay | Admitting: Primary Care

## 2017-05-15 DIAGNOSIS — J302 Other seasonal allergic rhinitis: Secondary | ICD-10-CM

## 2017-05-15 MED ORDER — CETIRIZINE HCL 10 MG PO TABS: 10.0000 mg | ORAL_TABLET | Freq: Every day | ORAL | 1 refills | Status: DC

## 2017-09-29 DIAGNOSIS — Z23 Encounter for immunization: Secondary | ICD-10-CM | POA: Diagnosis not present

## 2018-01-25 DIAGNOSIS — M62830 Muscle spasm of back: Secondary | ICD-10-CM | POA: Diagnosis not present

## 2018-01-25 DIAGNOSIS — M9902 Segmental and somatic dysfunction of thoracic region: Secondary | ICD-10-CM | POA: Diagnosis not present

## 2018-01-25 DIAGNOSIS — M9903 Segmental and somatic dysfunction of lumbar region: Secondary | ICD-10-CM | POA: Diagnosis not present

## 2018-01-25 DIAGNOSIS — K581 Irritable bowel syndrome with constipation: Secondary | ICD-10-CM | POA: Diagnosis not present

## 2018-01-30 DIAGNOSIS — K581 Irritable bowel syndrome with constipation: Secondary | ICD-10-CM | POA: Diagnosis not present

## 2018-01-30 DIAGNOSIS — M62830 Muscle spasm of back: Secondary | ICD-10-CM | POA: Diagnosis not present

## 2018-01-30 DIAGNOSIS — M9903 Segmental and somatic dysfunction of lumbar region: Secondary | ICD-10-CM | POA: Diagnosis not present

## 2018-01-30 DIAGNOSIS — M9902 Segmental and somatic dysfunction of thoracic region: Secondary | ICD-10-CM | POA: Diagnosis not present

## 2018-02-01 DIAGNOSIS — K581 Irritable bowel syndrome with constipation: Secondary | ICD-10-CM | POA: Diagnosis not present

## 2018-02-01 DIAGNOSIS — M9902 Segmental and somatic dysfunction of thoracic region: Secondary | ICD-10-CM | POA: Diagnosis not present

## 2018-02-01 DIAGNOSIS — M9903 Segmental and somatic dysfunction of lumbar region: Secondary | ICD-10-CM | POA: Diagnosis not present

## 2018-02-01 DIAGNOSIS — M62830 Muscle spasm of back: Secondary | ICD-10-CM | POA: Diagnosis not present

## 2018-02-06 DIAGNOSIS — K581 Irritable bowel syndrome with constipation: Secondary | ICD-10-CM | POA: Diagnosis not present

## 2018-02-06 DIAGNOSIS — M9902 Segmental and somatic dysfunction of thoracic region: Secondary | ICD-10-CM | POA: Diagnosis not present

## 2018-02-06 DIAGNOSIS — M9903 Segmental and somatic dysfunction of lumbar region: Secondary | ICD-10-CM | POA: Diagnosis not present

## 2018-02-06 DIAGNOSIS — M62830 Muscle spasm of back: Secondary | ICD-10-CM | POA: Diagnosis not present

## 2018-02-08 DIAGNOSIS — M9902 Segmental and somatic dysfunction of thoracic region: Secondary | ICD-10-CM | POA: Diagnosis not present

## 2018-02-08 DIAGNOSIS — M9903 Segmental and somatic dysfunction of lumbar region: Secondary | ICD-10-CM | POA: Diagnosis not present

## 2018-02-08 DIAGNOSIS — K581 Irritable bowel syndrome with constipation: Secondary | ICD-10-CM | POA: Diagnosis not present

## 2018-02-08 DIAGNOSIS — M62830 Muscle spasm of back: Secondary | ICD-10-CM | POA: Diagnosis not present

## 2018-02-13 DIAGNOSIS — K581 Irritable bowel syndrome with constipation: Secondary | ICD-10-CM | POA: Diagnosis not present

## 2018-02-13 DIAGNOSIS — M62830 Muscle spasm of back: Secondary | ICD-10-CM | POA: Diagnosis not present

## 2018-02-13 DIAGNOSIS — M9903 Segmental and somatic dysfunction of lumbar region: Secondary | ICD-10-CM | POA: Diagnosis not present

## 2018-02-13 DIAGNOSIS — M9902 Segmental and somatic dysfunction of thoracic region: Secondary | ICD-10-CM | POA: Diagnosis not present

## 2018-02-15 DIAGNOSIS — M9903 Segmental and somatic dysfunction of lumbar region: Secondary | ICD-10-CM | POA: Diagnosis not present

## 2018-02-15 DIAGNOSIS — K581 Irritable bowel syndrome with constipation: Secondary | ICD-10-CM | POA: Diagnosis not present

## 2018-02-15 DIAGNOSIS — M62830 Muscle spasm of back: Secondary | ICD-10-CM | POA: Diagnosis not present

## 2018-02-15 DIAGNOSIS — M9902 Segmental and somatic dysfunction of thoracic region: Secondary | ICD-10-CM | POA: Diagnosis not present

## 2018-02-20 DIAGNOSIS — K581 Irritable bowel syndrome with constipation: Secondary | ICD-10-CM | POA: Diagnosis not present

## 2018-02-20 DIAGNOSIS — M9903 Segmental and somatic dysfunction of lumbar region: Secondary | ICD-10-CM | POA: Diagnosis not present

## 2018-02-20 DIAGNOSIS — M9902 Segmental and somatic dysfunction of thoracic region: Secondary | ICD-10-CM | POA: Diagnosis not present

## 2018-02-20 DIAGNOSIS — M62830 Muscle spasm of back: Secondary | ICD-10-CM | POA: Diagnosis not present

## 2018-02-22 DIAGNOSIS — M62830 Muscle spasm of back: Secondary | ICD-10-CM | POA: Diagnosis not present

## 2018-02-22 DIAGNOSIS — M9903 Segmental and somatic dysfunction of lumbar region: Secondary | ICD-10-CM | POA: Diagnosis not present

## 2018-02-22 DIAGNOSIS — K581 Irritable bowel syndrome with constipation: Secondary | ICD-10-CM | POA: Diagnosis not present

## 2018-02-22 DIAGNOSIS — M9902 Segmental and somatic dysfunction of thoracic region: Secondary | ICD-10-CM | POA: Diagnosis not present

## 2018-02-27 DIAGNOSIS — K581 Irritable bowel syndrome with constipation: Secondary | ICD-10-CM | POA: Diagnosis not present

## 2018-02-27 DIAGNOSIS — M62830 Muscle spasm of back: Secondary | ICD-10-CM | POA: Diagnosis not present

## 2018-02-27 DIAGNOSIS — M9902 Segmental and somatic dysfunction of thoracic region: Secondary | ICD-10-CM | POA: Diagnosis not present

## 2018-02-27 DIAGNOSIS — M9903 Segmental and somatic dysfunction of lumbar region: Secondary | ICD-10-CM | POA: Diagnosis not present

## 2018-03-01 DIAGNOSIS — M9903 Segmental and somatic dysfunction of lumbar region: Secondary | ICD-10-CM | POA: Diagnosis not present

## 2018-03-01 DIAGNOSIS — K581 Irritable bowel syndrome with constipation: Secondary | ICD-10-CM | POA: Diagnosis not present

## 2018-03-01 DIAGNOSIS — M9902 Segmental and somatic dysfunction of thoracic region: Secondary | ICD-10-CM | POA: Diagnosis not present

## 2018-03-01 DIAGNOSIS — M62830 Muscle spasm of back: Secondary | ICD-10-CM | POA: Diagnosis not present

## 2018-03-06 DIAGNOSIS — K581 Irritable bowel syndrome with constipation: Secondary | ICD-10-CM | POA: Diagnosis not present

## 2018-03-06 DIAGNOSIS — M9903 Segmental and somatic dysfunction of lumbar region: Secondary | ICD-10-CM | POA: Diagnosis not present

## 2018-03-06 DIAGNOSIS — M62830 Muscle spasm of back: Secondary | ICD-10-CM | POA: Diagnosis not present

## 2018-03-06 DIAGNOSIS — M9902 Segmental and somatic dysfunction of thoracic region: Secondary | ICD-10-CM | POA: Diagnosis not present

## 2018-03-08 DIAGNOSIS — M62830 Muscle spasm of back: Secondary | ICD-10-CM | POA: Diagnosis not present

## 2018-03-08 DIAGNOSIS — M9902 Segmental and somatic dysfunction of thoracic region: Secondary | ICD-10-CM | POA: Diagnosis not present

## 2018-03-08 DIAGNOSIS — M9903 Segmental and somatic dysfunction of lumbar region: Secondary | ICD-10-CM | POA: Diagnosis not present

## 2018-03-08 DIAGNOSIS — K581 Irritable bowel syndrome with constipation: Secondary | ICD-10-CM | POA: Diagnosis not present

## 2018-04-26 ENCOUNTER — Ambulatory Visit (INDEPENDENT_AMBULATORY_CARE_PROVIDER_SITE_OTHER): Payer: BLUE CROSS/BLUE SHIELD | Admitting: Internal Medicine

## 2018-04-26 ENCOUNTER — Encounter: Payer: Self-pay | Admitting: Internal Medicine

## 2018-04-26 VITALS — BP 120/76 | HR 82 | Temp 98.1°F | Wt 169.0 lb

## 2018-04-26 DIAGNOSIS — J301 Allergic rhinitis due to pollen: Secondary | ICD-10-CM

## 2018-04-26 MED ORDER — METHYLPREDNISOLONE ACETATE 80 MG/ML IJ SUSP
80.0000 mg | Freq: Once | INTRAMUSCULAR | Status: AC
  Administered 2018-04-26: 80 mg via INTRAMUSCULAR

## 2018-04-26 NOTE — Progress Notes (Signed)
Subjective:    Patient ID: Willie Farmer, male    DOB: 07/12/1976, 42 y.o.   MRN: [id number]  HPI  Pt presents to the clinic today with c/o ear pain, runny nose, sore throat and cough. He reports this started 3 days ago. She describes the ear pain as without loss of hearing. He is blowing clear mucous out of his nose. He denies difficulty swallowing. The cough is productive of clear/yellow mucous. He reports he has coughed so much, he has vomiting. He denies fever, chills or body aches. He has tried Dayquil and Nyquil with minimal relief. He has a history of seasonal allergies. He has had sick contacts.  Review of Systems      Past Medical History:  Diagnosis Date  . Acid reflux   . Chicken pox   . Kidney stone 2015   Right side  . Lead poisoning   . Meningitis, viral 1995    Current Outpatient Medications  Medication Sig Dispense Refill  . amoxicillin-clavulanate (AUGMENTIN) 875-125 MG tablet Take 1 tablet by mouth 2 (two) times daily. 20 tablet 0  . cetirizine (ZYRTEC) 10 MG tablet Take 1 tablet (10 mg total) by mouth daily. 90 tablet 1  . fluticasone (FLONASE) 50 MCG/ACT nasal spray Place 1 spray into both nostrils 2 (two) times daily. 16 g 0  . meclizine (ANTIVERT) 25 MG tablet Take 25 mg by mouth 3 times/day as needed-between meals & bedtime for dizziness.    . predniSONE (STERAPRED UNI-PAK 21 TAB) 10 MG (21) TBPK tablet Take by mouth daily.     No current facility-administered medications for this visit.     No Known Allergies  Family History  Problem Relation Age of Onset  . Prostate cancer Father   . Prostate cancer Paternal Grandfather   . Prostate cancer Paternal Uncle   . Colon cancer Neg Hx     Social History   Socioeconomic History  . Marital status: Married    Spouse name: Not on file  . Number of children: 1  . Years of education: Not on file  . Highest education level: Not on file  Occupational History  . Occupation: Barista:  [employer]  Social Needs  . Financial resource strain: Not on file  . Food insecurity:    Worry: Not on file    Inability: Not on file  . Transportation needs:    Medical: Not on file    Non-medical: Not on file  Tobacco Use  . Smoking status: Never Smoker  . Smokeless tobacco: Never Used  Substance and Sexual Activity  . Alcohol use: Yes    Alcohol/week: 0.6 oz    Types: 1 Cans of beer per week    Comment: stopped drinking daily, may drink once every 3 weeks  . Drug use: No  . Sexual activity: Yes  Lifestyle  . Physical activity:    Days per week: Not on file    Minutes per session: Not on file  . Stress: Not on file  Relationships  . Social connections:    Talks on phone: Not on file    Gets together: Not on file    Attends religious service: Not on file    Active member of club or organization: Not on file    Attends meetings of clubs or organizations: Not on file    Relationship status: Not on file  . Intimate partner violence:    Fear of current or ex  partner: Not on file    Emotionally abused: Not on file    Physically abused: Not on file    Forced sexual activity: Not on file  Other Topics Concern  . Not on file  Social History Narrative  . Not on file     Constitutional:  Denies fever, malaise, fatigue, headache or abrupt weight changes.  HEENT: Pt reports ear pain, runny nose, sore throat. Denies eye pain, eye redness, ringing in the ears, wax buildup nasal congestion, bloody nose. Respiratory: Pt reports cough. Denies difficulty breathing, shortness of breath.   Cardiovascular: Denies chest pain, chest tightness, palpitations or swelling in the hands or feet.  Gastrointestinal: Pt reports vomiting. Denies abdominal pain, bloating, constipation, diarrhea or blood in the stool.   No other specific complaints in a complete review of systems (except as listed in HPI above).  Objective:   Physical Exam   BP 120/76   Pulse 82   Temp 98.1 F  (36.7 C) (Oral)   Wt 169 lb (76.7 kg)   SpO2 98%   BMI 21.70 kg/m  Wt Readings from Last 3 Encounters:  04/26/18 169 lb (76.7 kg)  03/14/17 167 lb (75.8 kg)  03/13/17 169 lb (76.7 kg)    General: Appears his stated age, well developed, well nourished in NAD. HEENT: Head: normal shape and size, no sinus tenderness noted;  Ears: Tm's pink butintact, normal light reflex; Nose: mucosa boggyand moist, turbinates swollen; Throat/Mouth: Teeth present, mucosa pink and moist, + PND, no exudate, lesions or ulcerations noted.  Neck:  No adenopathy noted. Cardiovascular: Normal rate and rhythm. S1,S2 noted.  No murmur, rubs or gallops noted. Pulmonary/Chest: Normal effort and positive vesicular breath sounds. No respiratory distress. No wheezes, rales or ronchi noted.  Neurological: Alert and oriented.    BMET    Component Value Date/Time   NA 140 07/14/2016 0935   K 4.5 07/14/2016 0935   CL 106 07/14/2016 0935   CO2 31 07/14/2016 0935   GLUCOSE 93 07/14/2016 0935   BUN 13 07/14/2016 0935   CREATININE 1.28 07/14/2016 0935   CREATININE 1.08 07/01/2013 0918   CALCIUM 9.5 07/14/2016 0935    Lipid Panel     Component Value Date/Time   CHOL 172 07/14/2016 0935   TRIG 53.0 07/14/2016 0935   HDL 39.70 07/14/2016 0935   CHOLHDL 4 07/14/2016 0935   VLDL 10.6 07/14/2016 0935   LDLCALC 122 (H) 07/14/2016 0935    CBC    Component Value Date/Time   WBC 4.4 06/02/2014 1558   RBC 4.78 06/02/2014 1558   HGB 13.3 06/02/2014 1558   HCT 40.2 06/02/2014 1558   PLT 214.0 06/02/2014 1558   MCV 84.1 06/02/2014 1558   MCV 88.8 12/24/2013 1046   MCH 27.3 12/24/2013 1046   MCHC 33.0 06/02/2014 1558   RDW 15.4 06/02/2014 1558   LYMPHSABS 1.6 06/02/2014 1558   MONOABS 0.4 06/02/2014 1558   EOSABS 0.1 06/02/2014 1558   BASOSABS 0.0 06/02/2014 1558    Hgb A1C No results found for: HGBA1C         Assessment & Plan:   Allergic Rhinitis:  80 mg Depo IM today Start Zyrtec and Flonase  OTC  Return precautions discussed Nicki Reaperegina Baity, NP

## 2018-04-26 NOTE — Patient Instructions (Signed)

## 2018-04-26 NOTE — Addendum Note (Signed)
Addended by: Roena MaladyEVONTENNO, Tazia Illescas Y on: 04/26/2018 02:23 PM   Modules accepted: Orders

## 2018-08-14 ENCOUNTER — Ambulatory Visit: Payer: BLUE CROSS/BLUE SHIELD | Admitting: Internal Medicine

## 2018-08-14 ENCOUNTER — Telehealth: Payer: Self-pay | Admitting: Primary Care

## 2018-08-14 NOTE — Telephone Encounter (Signed)
See below CRM Ok to schedule transfer appointment  Copied from CRM [id number]. Topic: Inquiry >> Aug 14, 2018 12:55 PM Baldo Daub L wrote: Reason for CRM:   Pt calling.  States he wants to [facility] from [place] to [facility].  States his wife wants them to see the same provider.  Is this transfer okay?

## 2018-08-14 NOTE — Telephone Encounter (Signed)
Appointment 10/7 °Pt aware °

## 2018-08-14 NOTE — Telephone Encounter (Signed)
Fine with me

## 2018-08-14 NOTE — Telephone Encounter (Signed)
Fine with me, thank you! ?

## 2018-08-15 ENCOUNTER — Ambulatory Visit: Payer: BLUE CROSS/BLUE SHIELD | Admitting: Primary Care

## 2018-08-20 ENCOUNTER — Ambulatory Visit: Payer: BLUE CROSS/BLUE SHIELD | Admitting: Internal Medicine

## 2018-08-20 ENCOUNTER — Encounter: Payer: Self-pay | Admitting: Internal Medicine

## 2018-08-20 DIAGNOSIS — K219 Gastro-esophageal reflux disease without esophagitis: Secondary | ICD-10-CM

## 2018-08-20 DIAGNOSIS — J302 Other seasonal allergic rhinitis: Secondary | ICD-10-CM | POA: Diagnosis not present

## 2018-08-20 DIAGNOSIS — F39 Unspecified mood [affective] disorder: Secondary | ICD-10-CM | POA: Insufficient documentation

## 2018-08-20 NOTE — Assessment & Plan Note (Signed)
Support offered today He will continue seeing therapy He plans on doing ECG He is not interested in medication management at this time

## 2018-08-20 NOTE — Patient Instructions (Signed)
Supporting Someone With Depression Depression is a mental health condition that affects the way a person feels, thinks, and handles daily activities such as eating, sleeping, and working. When a person has depression, his or her condition can affect others around him or her, such as friends and family members. Friends and family can help by offering support and understanding. What do I need to know about this condition? The main symptoms of depression are:  Constant depressed or irritable mood.  Loss of interest in things and activities that were enjoyed in the past.  Other symptoms of depression include:  Fatigue.  Sleeping too much or too little.  Difficulty falling asleep, or waking up early and not being able to get back to sleep.  Difficulty concentrating or making decisions.  Changes in appetite and weight.  Staying away from others (isolating oneself).  Expressing feelings of guilt.  Expressing suicidal thoughts or feelings.  What do I need to know about the treatment options? This condition is usually treated by mental health providers such as psychologists, psychiatrists, and clinical social workers. Treatment may include one or more of the following:  Psychotherapy, also called talk therapy or counseling. Types of psychotherapy include individual or group therapy, and they usually involve the following approaches: ? Cognitive behavioral therapy (CBT). This type of therapy teaches a person how to recognize feelings, thoughts, and behaviors that contribute to depression. The person is taught to make a choice about how to respond to these feelings, thoughts, and behaviors so that he or she can experience fewer symptoms. ? Interpersonal therapy (IPT). This helps to improve the way that someone with depression relates to and communicates with others. This type of therapy may involve caregivers and loved ones. ? Family therapy. This treatment helps family members to communicate  and deal with conflict in healthy ways.  Medicine. This is often used to help with certain emotions and behaviors.  Combining medicine and therapy is often the most effective approach. The following lifestyle changes may also help with managing symptoms of depression:  Limiting alcohol and drug use.  Exercising regularly.  Getting enough good-quality sleep.  Making healthy eating choices.  Reducing distressing situations.  Spending time outside.  Following regular daily routines.  How can I support my loved one? Talk about the condition Good communication is the key to supporting your friend or family member. Here are a few things to keep in mind:  Ask your loved one how you can support him or her.  Respect your loved one's right to make decisions.  Be careful about too much prodding. Try not to overdo reminders to an adult friend or family member about things like taking medicines. Ask how your loved one prefers that you help.  Be encouraging and offer emotional support. This can help to lower stress. Even saying something simple to comfort your loved one may help.  Never ignore comments about suicide, and do not try to avoid the subject of suicide. Talking about suicide will not make your loved one want to act on it. You or your loved one can reach out 24 hours a day to get free, private support (on the phone or a live online chat) from a suicide crisis helpline, such as the [facility] at 613 587 9957.  Listening is very important. Be available if your friend or family member wants to talk. Make an effort to acknowledge his or her feelings and stay calm and realistic.  Find support and resources A health  care provider may be able to recommend mental health resources that are available online or over the phone. You could start with:  Government sites such as the Substance Abuse and Mental Health Services Administration (SAMHSA):  SkateOasis.com.pt  National mental health organizations such as the [employer] on Mental Illness (NAMI): www.nami.org  You may also consider:  Joining self-help and support groups, not only for your friend or family member, but also for yourself. People in these peer and family support groups understand what you and your loved one are going through. They can help you feel a sense of hope and connect you with local resources to help you learn more.  Attending family therapy with your loved one.  General support  Make an effort to learn all you can about depression.  Help your loved one follow his or her treatment plan as directed by health care providers. This could mean driving him or her to therapy sessions or suggesting ways to cope with stress.  Ask your loved one if you may join him or her for a therapy session or go with him or her to health care visits. Joining your loved one with his or her permission can give you an opportunity to learn how to be more supportive.  Include your loved one in activities. Invite her or him to go for walks and outings. At first, your loved one may not want to, but keep trying.  Be patient and do not expect your loved one to do too much too soon.  Help with daily responsibilities, such as laundry or meals. Sometimes daily tasks seem overwhelming to a person with depression.  Remember that your support really matters. Social support is a huge benefit for someone who is coping with depression. How can I create a safe environment? If your loved one feels unable to control his or her behavior, it may be necessary to take steps to keep his or her home safe. Such steps may include:  Locking up alcohol and prescription pills that your loved one may turn to. Count prescription pills often. You may want to consider removing alcohol from the home.  Removing or locking up guns and other weapons. If you do not have a safe place to keep a gun, local law  enforcement may store a gun for you.  Making a written crisis plan. Include important phone numbers, such as the local crisis intervention team. Make sure that: ? The person with depression knows about this plan. ? Everyone who has regular contact with that person knows about the plan and knows what to do in an emergency.  How should I care for myself? \[email or web address]

## 2018-08-20 NOTE — Assessment & Plan Note (Signed)
Controlled with Zyrtec and Flonase as needed

## 2018-08-20 NOTE — Assessment & Plan Note (Signed)
Currently not an issue Will monitor 

## 2018-08-20 NOTE — Progress Notes (Signed)
HPI  Pt presents to the clinic today to establish care and for management of the conditions listed below. He is transferring care from Mayra Reel, NP-C.  GERD: Currently not an issue. Triggered by stress.  Seasonal Allergies: Worse in the spring. He takes Zyrtec and Flonase as needed.  He reports he is currently seeing a therapist. She has diagnosed him with PTSD and depression. She suggested he start [place] Wort OTC, but after reading up this, he is not interested in this medication. He plans on doing some electro shock therapy first.  Flu: 06/2018 Tetanus: < 10 years ago Vision Screening: as needed Dentist: as needed  Past Medical History:  Diagnosis Date  . Acid reflux   . Chicken pox   . Kidney stone 2015   Right side  . Lead poisoning   . Meningitis, viral 1995    Current Outpatient Medications  Medication Sig Dispense Refill  . cetirizine (ZYRTEC) 10 MG tablet Take 1 tablet (10 mg total) by mouth daily. (Patient not taking: Reported on 04/26/2018) 90 tablet 1  . fluticasone (FLONASE) 50 MCG/ACT nasal spray Place 1 spray into both nostrils 2 (two) times daily. (Patient not taking: Reported on 04/26/2018) 16 g 0   No current facility-administered medications for this visit.     No Known Allergies  Family History  Problem Relation Age of Onset  . Prostate cancer Father   . Prostate cancer Paternal Grandfather   . Prostate cancer Paternal Uncle   . Colon cancer Neg Hx     Social History   Socioeconomic History  . Marital status: Married    Spouse name: Not on file  . Number of children: 1  . Years of education: Not on file  . Highest education level: Not on file  Occupational History  . Occupation: Barista: [employer]  Social Needs  . Financial resource strain: Not on file  . Food insecurity:    Worry: Not on file    Inability: Not on file  . Transportation needs:    Medical: Not on file    Non-medical: Not on file  Tobacco  Use  . Smoking status: Never Smoker  . Smokeless tobacco: Never Used  Substance and Sexual Activity  . Alcohol use: Yes    Alcohol/week: 1.0 standard drinks    Types: 1 Cans of beer per week    Comment: stopped drinking daily, may drink once every 3 weeks  . Drug use: No  . Sexual activity: Yes  Lifestyle  . Physical activity:    Days per week: Not on file    Minutes per session: Not on file  . Stress: Not on file  Relationships  . Social connections:    Talks on phone: Not on file    Gets together: Not on file    Attends religious service: Not on file    Active member of club or organization: Not on file    Attends meetings of clubs or organizations: Not on file    Relationship status: Not on file  . Intimate partner violence:    Fear of current or ex partner: Not on file    Emotionally abused: Not on file    Physically abused: Not on file    Forced sexual activity: Not on file  Other Topics Concern  . Not on file  Social History Narrative  . Not on file    ROS:  Constitutional: Denies fever, malaise, fatigue, headache or  abrupt weight changes.  HEENT: Denies eye pain, eye redness, ear pain, ringing in the ears, wax buildup, runny nose, nasal congestion, bloody nose, or sore throat. Respiratory: Denies difficulty breathing, shortness of breath, cough or sputum production.   Cardiovascular: Denies chest pain, chest tightness, palpitations or swelling in the hands or feet.  Gastrointestinal: Denies abdominal pain, bloating, constipation, diarrhea or blood in the stool.  GU: Denies frequency, urgency, pain with urination, blood in urine, odor or discharge. Musculoskeletal: Denies decrease in range of motion, difficulty with gait, muscle pain or joint pain and swelling.  Skin: Denies redness, rashes, lesions or ulcercations.  Neurological: Denies dizziness, difficulty with memory, difficulty with speech or problems with balance and coordination.  Psych: Pt reports stress,  depression. Denies anxiety, SI/HI.  No other specific complaints in a complete review of systems (except as listed in HPI above).  PE:  BP 122/74   Pulse 74   Temp 97.9 F (36.6 C) (Oral)   Wt 168 lb (76.2 kg)   SpO2 98%   BMI 21.57 kg/m   Wt Readings from Last 3 Encounters:  04/26/18 169 lb (76.7 kg)  03/14/17 167 lb (75.8 kg)  03/13/17 169 lb (76.7 kg)    General: Appears his stated age, well developed, well nourished in NAD. Cardiovascular: Normal rate and rhythm. S1,S2 noted.  No murmur, rubs or gallops noted. Pulmonary/Chest: Normal effort and positive vesicular breath sounds. No respiratory distress. No wheezes, rales or ronchi noted.  Abdomen: Soft and nontender. Normal bowel sounds, no bruits noted. No distention or masses noted. Neurological: Alert and oriented.  Psychiatric: Mood and affect mildly flat. Behavior is normal. Judgment and thought content normal.     BMET    Component Value Date/Time   NA 140 07/14/2016 0935   K 4.5 07/14/2016 0935   CL 106 07/14/2016 0935   CO2 31 07/14/2016 0935   GLUCOSE 93 07/14/2016 0935   BUN 13 07/14/2016 0935   CREATININE 1.28 07/14/2016 0935   CREATININE 1.08 07/01/2013 0918   CALCIUM 9.5 07/14/2016 0935    Lipid Panel     Component Value Date/Time   CHOL 172 07/14/2016 0935   TRIG 53.0 07/14/2016 0935   HDL 39.70 07/14/2016 0935   CHOLHDL 4 07/14/2016 0935   VLDL 10.6 07/14/2016 0935   LDLCALC 122 (H) 07/14/2016 0935    CBC    Component Value Date/Time   WBC 4.4 06/02/2014 1558   RBC 4.78 06/02/2014 1558   HGB 13.3 06/02/2014 1558   HCT 40.2 06/02/2014 1558   PLT 214.0 06/02/2014 1558   MCV 84.1 06/02/2014 1558   MCV 88.8 12/24/2013 1046   MCH 27.3 12/24/2013 1046   MCHC 33.0 06/02/2014 1558   RDW 15.4 06/02/2014 1558   LYMPHSABS 1.6 06/02/2014 1558   MONOABS 0.4 06/02/2014 1558   EOSABS 0.1 06/02/2014 1558   BASOSABS 0.0 06/02/2014 1558    Hgb A1C No results found for: HGBA1C   Assessment  and Plan:

## 2018-08-22 ENCOUNTER — Encounter (HOSPITAL_COMMUNITY): Payer: Self-pay

## 2018-08-22 ENCOUNTER — Emergency Department (HOSPITAL_COMMUNITY)
Admission: EM | Admit: 2018-08-22 | Discharge: 2018-08-23 | Disposition: A | Discharge status: Home / Self Care | Payer: BLUE CROSS/BLUE SHIELD | Attending: Emergency Medicine | Admitting: Emergency Medicine

## 2018-08-22 DIAGNOSIS — X509XXA Other and unspecified overexertion or strenuous movements or postures, initial encounter: Secondary | ICD-10-CM | POA: Diagnosis not present

## 2018-08-22 DIAGNOSIS — S93492A Sprain of other ligament of left ankle, initial encounter: Secondary | ICD-10-CM | POA: Diagnosis not present

## 2018-08-22 DIAGNOSIS — S8992XA Unspecified injury of left lower leg, initial encounter: Secondary | ICD-10-CM | POA: Diagnosis present

## 2018-08-22 DIAGNOSIS — Z79899 Other long term (current) drug therapy: Secondary | ICD-10-CM | POA: Insufficient documentation

## 2018-08-22 DIAGNOSIS — Y929 Unspecified place or not applicable: Secondary | ICD-10-CM | POA: Diagnosis not present

## 2018-08-22 DIAGNOSIS — Y999 Unspecified external cause status: Secondary | ICD-10-CM | POA: Diagnosis not present

## 2018-08-22 DIAGNOSIS — Y939 Activity, unspecified: Secondary | ICD-10-CM | POA: Insufficient documentation

## 2018-08-22 NOTE — ED Triage Notes (Signed)
Pt complains of a sharp pain in his left lower leg this afternoon, he says the pain is starting to subside, he has a swollen area on that ankle but no injury, pt is unable to straighten or bend his toes back on the left foot

## 2018-08-23 ENCOUNTER — Telehealth: Payer: Self-pay

## 2018-08-23 NOTE — Telephone Encounter (Signed)
ED note reviewed 

## 2018-08-23 NOTE — Telephone Encounter (Signed)
PLEASE NOTE: All timestamps contained within this report are represented as [place] Standard Time. CONFIDENTIALTY NOTICE: This fax transmission is intended only for the addressee. It contains information that is legally privileged, confidential or otherwise protected from use or disclosure. If you are not the intended recipient, you are strictly prohibited from reviewing, disclosing, copying using or disseminating any of this information or taking any action in reliance on or regarding this information. If you have received this fax in error, please notify us immediately by telephone so that we can arrange for its return to [place]. Phone: [phone], Toll-Free: [phone], Fax: [phone] Page: 1 of 2 Call Id: [id number] [facility] Night - Client TELEPHONE ADVICE RECORD [facility] Patient Name: Willie Farmer Gender: Male DOB: 02-07-1976 Age: 42 Y 11 M 4 D Return Phone Number: [phone] (Primary) Address[place] [address] Client [facility] Night - Client Client Site [facility] - Night Physician Nicki Reaper - NP Contact Type Call Who Is Calling Patient / Member / Family / Caregiver Call Type Triage / Clinical Relationship To Patient Self Return Phone Number [phone] (Primary) Chief Complaint Leg Pain Reason for Call Symptomatic / Request for Health Information Initial Comment Caller states having pain 3 inches above his ankle that is radiating up his leg and is getting worse. Translation No Nurse Assessment Nurse: Wyn Quaker, RN, Marylene Land Date/Time (Eastern Time): 08/22/2018 9:50:14 PM Confirm and document reason for call. If symptomatic, describe symptoms. ---Caller stated that he is having pain that is radiating up his left leg from just above his left ankle. It hurt worse after he walked on it. No redness, swelling, or known hx of injury. Pain is radiating, aching, rated at  9-10/10. Does the patient have any new or worsening symptoms? ---Yes Will a triage be completed? ---Yes Related visit to physician within the last 2 weeks? ---Yes Does the PT have any chronic conditions? (i.e. diabetes, asthma, this includes High risk factors for pregnancy, etc.) ---No Is this a behavioral health or substance abuse call? ---No Guidelines Guideline Title Affirmed Question Affirmed Notes Nurse Date/Time (Eastern Time) Leg Pain Unable to walk Wyn Quaker, RN, Marylene Land 08/22/2018 9:52:55 PM Disp. Time Lamount Cohen Time) Disposition Final User 08/22/2018 9:55:27 PM Go to ED Now Yes Dew, RN, Rosalyn Charters Disagree/Comply Comply Caller Understands Yes PreDisposition Did not know what to do Care Advice Given Per Guideline PLEASE NOTE: All timestamps contained within this report are represented as [place] Standard Time. CONFIDENTIALTY NOTICE: This fax transmission is intended only for the addressee. It contains information that is legally privileged, confidential or otherwise protected from use or disclosure. If you are not the intended recipient, you are strictly prohibited from reviewing, disclosing, copying using or disseminating any of this information or taking any action in reliance on or regarding this information. If you have received this fax in error, please notify us immediately by telephone so that we can arrange for its return to [place]. Phone: [phone], Toll-Free: [phone], Fax: [phone] Page: 2 of 2 Call Id: [id number] Care Advice Given Per Guideline GO TO ED NOW: * You need to be seen in the Emergency Department. * Go to the ED at ___________ Hospital. * Leave now. Drive carefully. CARE ADVICE given per Leg Pain (Adult) guideline. Referrals GO TO FACILITY UNDECIDED GO TO FACILITY UNDECIDED

## 2018-08-23 NOTE — ED Provider Notes (Signed)
[facility]-EMERGENCY DEPT Provider Note  CSN: [id number] Arrival date & time: 08/22/18 2240  Chief Complaint(s) Leg Pain  HPI Willie Farmer is a 42 y.o. male who presents to the emergency department with sudden onset of left lower extremity pain.  He reports that the pain began initially in the ankle which radiated up his leg.  Pain was initially severe and has since subsided after taking Aleve.  Pain is aching and throbbing in nature.  Exacerbated with ambulation and range of motion of the ankle.  He cannot pinpoint any specific injury but reports he was walking in uneven grass just prior to the pain starting.  Denies any other physical complaints.  He is concerned of possible blood clots but denies any recent prolonged immobilization, prior history of DVT/PEs, clotting disorders, hormone replacement therapy, history of cancer.  HPI  Past Medical History Past Medical History:  Diagnosis Date  . Acid reflux   . Chicken pox   . Lead poisoning   . Meningitis, viral 1995   Patient Active Problem List   Diagnosis Date Noted  . GERD (gastroesophageal reflux disease) 08/20/2018  . Mood disorder (HCC) 08/20/2018  . Seasonal allergic rhinitis 03/02/2017   Home Medication(s) Prior to Admission medications   Medication Sig Start Date End Date Taking? Authorizing Provider  cetirizine (ZYRTEC) 10 MG tablet Take 1 tablet (10 mg total) by mouth daily. 05/15/17   Doreene Nest, NP  fluticasone (FLONASE) 50 MCG/ACT nasal spray Place 1 spray into both nostrils 2 (two) times daily. 03/02/17   Doreene Nest, NP                                                                                                                                    Past Surgical History Past Surgical History:  Procedure Laterality Date  . ESOPHAGOGASTRODUODENOSCOPY  2013  . WISDOM TOOTH EXTRACTION     Family History Family History  Problem Relation Age of Onset  . Prostate cancer Father   .  Cancer Mother   . Prostate cancer Paternal Grandfather   . Cancer Paternal Grandfather   . Prostate cancer Paternal Uncle   . Colon cancer Neg Hx     Social History Social History   Tobacco Use  . Smoking status: Never Smoker  . Smokeless tobacco: Never Used  Substance Use Topics  . Alcohol use: Yes    Alcohol/week: 1.0 standard drinks    Types: 1 Cans of beer per week    Comment: stopped drinking daily, may drink once every 3 weeks  . Drug use: No   Allergies Patient has no known allergies.  Review of Systems Review of Systems All other systems are reviewed and are negative for acute change except as noted in the HPI  Physical Exam Vital Signs  I have reviewed the triage vital signs BP (!) 147/97 (BP Location: Left Arm)   Pulse 85  Temp 98.7 F (37.1 C) (Oral)   Resp 17   Ht 6\' 3"  (1.905 m)   Wt 76.2 kg   SpO2 100%   BMI 21.00 kg/m   Physical Exam  Constitutional: He is oriented to person, place, and time. He appears well-developed and well-nourished. No distress.  HENT:  Head: Normocephalic and atraumatic.  Right Ear: External ear normal.  Left Ear: External ear normal.  Nose: Nose normal.  Mouth/Throat: Mucous membranes are normal. No trismus in the jaw.  Eyes: Conjunctivae and EOM are normal. No scleral icterus.  Neck: Normal range of motion and phonation normal.  Cardiovascular: Normal rate and regular rhythm.  Pulmonary/Chest: Effort normal. No stridor. No respiratory distress.  Abdominal: He exhibits no distension.  Musculoskeletal: Normal range of motion. He exhibits no edema.       Left ankle: He exhibits swelling (mild over AITFL). He exhibits normal range of motion, no deformity and normal pulse. Tenderness. AITFL tenderness found. No CF ligament, no posterior TFL, no head of 5th metatarsal and no proximal fibula tenderness found.  Neurological: He is alert and oriented to person, place, and time.  Skin: He is not diaphoretic.  Psychiatric: He has  a normal mood and affect. His behavior is normal.  Vitals reviewed.   ED Results and Treatments Labs (all labs ordered are listed, but only abnormal results are displayed) Labs Reviewed - No data to display                                                                                                                       EKG  EKG Interpretation  Date/Time:    Ventricular Rate:    PR Interval:    QRS Duration:   QT Interval:    QTC Calculation:   R Axis:     Text Interpretation:        Radiology No results found. Pertinent labs & imaging results that were available during my care of the patient were reviewed by me and considered in my medical decision making (see chart for details).  Medications Ordered in ED Medications - No data to display                                                                                                                                  Procedures Procedures  (including critical care time)  Medical Decision Making / ED Course I have reviewed the nursing  notes for this encounter and the patient's prior records (if available in EHR or on provided paperwork).    Right ankle pain.  No instability.  No obvious signs of trauma.  Possibly mild ankle sprain.  Not suspicious for DVT.  Pulses intact and presentation not suspicious for acute arterial occlusion. No need for imaging at this time.  Recommended symptomatic management.   The patient appears reasonably screened and/or stabilized for discharge and I doubt any other medical condition or other [facility] requiring further screening, evaluation, or treatment in the ED at this time prior to discharge.  The patient is safe for discharge with strict return precautions.   Final Clinical Impression(s) / ED Diagnoses Final diagnoses:  Sprain of anterior talofibular ligament of left ankle, initial encounter   Disposition: Discharge  Condition: Good  I have discussed the results, Dx and Tx plan with  the patient who expressed understanding and agree(s) with the plan. Discharge instructions discussed at great length. The patient was given strict return precautions who verbalized understanding of the instructions. No further questions at time of discharge.    ED Discharge Orders    None       Follow Up: Lorre Munroe, NP [address] [place] [address] [phone]  Schedule an appointment as soon as possible for a visit  in 2-3 weeks, If symptoms do not improve or  worsen      This chart was dictated using voice recognition software.  Despite best efforts to proofread,  errors can occur which can change the documentation meaning.   Nira Conn, MD 08/23/18 [phone]

## 2018-08-23 NOTE — Telephone Encounter (Signed)
Per chart review tab pt went to [facility] ED on 08/22/18.

## 2018-12-31 ENCOUNTER — Ambulatory Visit: Payer: BLUE CROSS/BLUE SHIELD | Admitting: Family Medicine

## 2018-12-31 ENCOUNTER — Ambulatory Visit: Payer: BLUE CROSS/BLUE SHIELD | Admitting: Internal Medicine

## 2018-12-31 ENCOUNTER — Encounter: Payer: Self-pay | Admitting: Family Medicine

## 2018-12-31 ENCOUNTER — Ambulatory Visit (INDEPENDENT_AMBULATORY_CARE_PROVIDER_SITE_OTHER)
Admission: RE | Admit: 2018-12-31 | Discharge: 2018-12-31 | Disposition: A | Discharge status: Home / Self Care | Payer: BLUE CROSS/BLUE SHIELD | Source: Ambulatory Visit | Attending: Family Medicine | Admitting: Family Medicine

## 2018-12-31 VITALS — BP 100/70 | HR 67 | Temp 98.2°F | Ht 72.0 in | Wt 171.8 lb

## 2018-12-31 DIAGNOSIS — J0100 Acute maxillary sinusitis, unspecified: Secondary | ICD-10-CM | POA: Diagnosis not present

## 2018-12-31 DIAGNOSIS — S99922A Unspecified injury of left foot, initial encounter: Secondary | ICD-10-CM | POA: Diagnosis not present

## 2018-12-31 DIAGNOSIS — M79675 Pain in left toe(s): Secondary | ICD-10-CM

## 2018-12-31 MED ORDER — AMOXICILLIN-POT CLAVULANATE 875-125 MG PO TABS: 1.0000 | ORAL_TABLET | Freq: Two times a day (BID) | ORAL | 0 refills | Status: AC

## 2018-12-31 NOTE — Progress Notes (Signed)
Dr. Karleen Hampshire T. Mikhayla Phillis, MD, CAQ Sports Medicine [facility] [address] [place], [address] Phone: [phone] Fax: [phone]  12/31/2018  Patient: Willie Farmer, MRN: [id number], DOB: March 12, 1976, 43 y.o.  Primary Physician:  Lorre Munroe, NP   Chief Complaint  Patient presents with  . Toe Injury    Left Middle Toe  . Emesis    x 4 weeks  . Sinus Drainage  . Nasal Congestion   Subjective:   Willie Farmer is a 43 y.o. very pleasant adult patient who presents with the following:  L 3rd toe - 7/10 pain.  Took some tylenol this morning. Hit his toe midnight last night.   4 weeks, sinusitis.   He struck his toe on the left third digit last night at midnight, and it is been hurting quite a bit since then.  He has some mild swelling and no bruising.  He did take Tylenol this morning which relieved his pain.  He also has been having a lot of sinus pressure and congestion and pressure in the maxillary sinus region for approximately 4 weeks.  He also has had postnasal drainage and occasional emesis more this time.  He has been afebrile and not complaining of headache, polyarthralgias, or earache.  He has been taking over-the-counter Sudafed.  Past Medical History, Surgical History, Social History, Family History, Problem List, Medications, and Allergies have been reviewed and updated if relevant.  Patient Active Problem List   Diagnosis Date Noted  . GERD (gastroesophageal reflux disease) 08/20/2018  . Mood disorder (HCC) 08/20/2018  . Seasonal allergic rhinitis 03/02/2017    Past Medical History:  Diagnosis Date  . Acid reflux   . Chicken pox   . Lead poisoning   . Meningitis, viral 1995    Past Surgical History:  Procedure Laterality Date  . ESOPHAGOGASTRODUODENOSCOPY  2013  . WISDOM TOOTH EXTRACTION      Social History   Socioeconomic History  . Marital status: Married    Spouse name: Not on file  . Number of children: 1  . Years  of education: Not on file  . Highest education level: Not on file  Occupational History  . Occupation: Barista: [employer]  Social Needs  . Financial resource strain: Not on file  . Food insecurity:    Worry: Not on file    Inability: Not on file  . Transportation needs:    Medical: Not on file    Non-medical: Not on file  Tobacco Use  . Smoking status: Never Smoker  . Smokeless tobacco: Never Used  Substance and Sexual Activity  . Alcohol use: Yes    Alcohol/week: 1.0 standard drinks    Types: 1 Cans of beer per week    Comment: stopped drinking daily, may drink once every 3 weeks  . Drug use: No  . Sexual activity: Yes  Lifestyle  . Physical activity:    Days per week: Not on file    Minutes per session: Not on file  . Stress: Not on file  Relationships  . Social connections:    Talks on phone: Not on file    Gets together: Not on file    Attends religious service: Not on file    Active member of club or organization: Not on file    Attends meetings of clubs or organizations: Not on file    Relationship status: Not on file  . Intimate partner violence:  Fear of current or ex partner: Not on file    Emotionally abused: Not on file    Physically abused: Not on file    Forced sexual activity: Not on file  Other Topics Concern  . Not on file  Social History Narrative  . Not on file    Family History  Problem Relation Age of Onset  . Prostate cancer Father   . Cancer Mother   . Prostate cancer Paternal Grandfather   . Cancer Paternal Grandfather   . Prostate cancer Paternal Uncle   . Colon cancer Neg Hx     No Known Allergies  Medication list reviewed and updated in full in [facility].  GEN: No fevers, chills. Nontoxic. Primarily MSK c/o today. MSK: Detailed in the HPI GI: tolerating PO intake without difficulty Neuro: No numbness, parasthesias, or tingling associated. Otherwise the pertinent positives of the ROS are  noted above.   Objective:   BP 100/70   Pulse 67   Temp 98.2 F (36.8 C) (Oral)   Ht 6' (1.829 m)   Wt 171 lb 12 oz (77.9 kg)   SpO2 98%   BMI 23.29 kg/m    Gen: WDWN, NAD; alert,appropriate and cooperative throughout exam    HEENT: Normocephalic and atraumatic. Throat clear, w/o exudate, no LAD, R TM clear, L TM - good landmarks, No fluid present. rhinnorhea.  Left frontal and maxillary sinuses: Tender Right frontal and maxillary sinuses: Tender    Neck: No ant or post LAD  CV: RRR, No M/G/R  Pulm: Breathing comfortably in no resp distress. no w/c/r  Abd: S,NT,ND,+BS Extr: no c/c/e Psych: full affect, pleasant   The left third toe is modestly tender at the base, but all other toes and metatarsals are nontender to palpate as well as the entirety of the foot and ankle.  Radiology: No results found.  Assessment and Plan:   Toe pain, left - Plan: DG Toe 3rd Left  Acute non-recurrent maxillary sinusitis  3 view third toe series is reviewed independently, and there is no evidence for acute fracture.  4 weeks of symptomatic sinus pressure and pain, we will treat this with Augmentin, also recommended Nettie pot.  Follow-up: No follow-ups on file.  Meds ordered this encounter  Medications  . amoxicillin-clavulanate (AUGMENTIN) 875-125 MG tablet    Sig: Take 1 tablet by mouth 2 (two) times daily for 10 days.    Dispense:  20 tablet    Refill:  0   Orders Placed This Encounter  Procedures  . DG Toe 3rd Left    Signed,  Bryceson Grape T. Sui Kasparek, MD   Outpatient Encounter Medications as of 12/31/2018  Medication Sig  . fluticasone (FLONASE) 50 MCG/ACT nasal spray Place 1 spray into both nostrils 2 (two) times daily.  . Pseudoephedrine HCl (SUDAFED PO) Take by mouth.  Marland Kitchen amoxicillin-clavulanate (AUGMENTIN) 875-125 MG tablet Take 1 tablet by mouth 2 (two) times daily for 10 days.  . cetirizine (ZYRTEC) 10 MG tablet Take 1 tablet (10 mg total) by mouth daily. (Patient not  taking: Reported on 12/31/2018)   No facility-administered encounter medications on file as of 12/31/2018.

## 2019-04-11 DIAGNOSIS — F4322 Adjustment disorder with anxiety: Secondary | ICD-10-CM | POA: Diagnosis not present

## 2019-04-19 DIAGNOSIS — F4322 Adjustment disorder with anxiety: Secondary | ICD-10-CM | POA: Diagnosis not present

## 2019-04-24 DIAGNOSIS — F4322 Adjustment disorder with anxiety: Secondary | ICD-10-CM | POA: Diagnosis not present

## 2019-04-26 DIAGNOSIS — F4322 Adjustment disorder with anxiety: Secondary | ICD-10-CM | POA: Diagnosis not present

## 2019-05-03 DIAGNOSIS — F4320 Adjustment disorder, unspecified: Secondary | ICD-10-CM | POA: Diagnosis not present

## 2019-05-06 ENCOUNTER — Telehealth: Payer: Self-pay | Admitting: Internal Medicine
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
## 2019-05-07 ENCOUNTER — Ambulatory Visit: Payer: BC Managed Care – PPO | Admitting: Internal Medicine

## 2019-05-07 ENCOUNTER — Other Ambulatory Visit: Payer: Self-pay

## 2019-05-07 ENCOUNTER — Encounter: Payer: Self-pay | Admitting: Internal Medicine

## 2019-05-07 VITALS — BP 116/74 | HR 75 | Temp 97.9°F | Wt 162.0 lb

## 2019-05-07 DIAGNOSIS — M546 Pain in thoracic spine: Secondary | ICD-10-CM

## 2019-05-07 NOTE — Patient Instructions (Signed)

## 2019-05-07 NOTE — Telephone Encounter (Signed)
I spoke with pt; pt just finished appt with Avie Echevaria NP. Nothing further needed.
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
## 2019-05-08 DIAGNOSIS — F4322 Adjustment disorder with anxiety: Secondary | ICD-10-CM | POA: Diagnosis not present

## 2019-05-17 DIAGNOSIS — F4310 Post-traumatic stress disorder, unspecified: Secondary | ICD-10-CM | POA: Diagnosis not present

## 2019-05-28 DIAGNOSIS — F4322 Adjustment disorder with anxiety: Secondary | ICD-10-CM | POA: Diagnosis not present

## 2019-06-14 DIAGNOSIS — F4322 Adjustment disorder with anxiety: Secondary | ICD-10-CM | POA: Diagnosis not present

## 2019-08-11 DIAGNOSIS — Z23 Encounter for immunization: Secondary | ICD-10-CM | POA: Diagnosis not present

## 2019-08-28 ENCOUNTER — Other Ambulatory Visit: Payer: Self-pay

## 2019-08-28 ENCOUNTER — Emergency Department (HOSPITAL_COMMUNITY)
Admission: EM | Admit: 2019-08-28 | Discharge: 2019-08-29 | Disposition: A | Discharge status: Home / Self Care | Payer: BC Managed Care – PPO | Attending: Emergency Medicine | Admitting: Emergency Medicine

## 2019-08-28 ENCOUNTER — Encounter (HOSPITAL_COMMUNITY): Payer: Self-pay | Admitting: *Deleted

## 2019-08-28 ENCOUNTER — Emergency Department (HOSPITAL_COMMUNITY): Payer: BC Managed Care – PPO

## 2019-08-28 DIAGNOSIS — R109 Unspecified abdominal pain: Secondary | ICD-10-CM | POA: Diagnosis not present

## 2019-08-28 DIAGNOSIS — Z79899 Other long term (current) drug therapy: Secondary | ICD-10-CM | POA: Diagnosis not present

## 2019-08-28 DIAGNOSIS — N200 Calculus of kidney: Secondary | ICD-10-CM | POA: Diagnosis not present

## 2019-08-28 DIAGNOSIS — R319 Hematuria, unspecified: Secondary | ICD-10-CM | POA: Diagnosis present

## 2019-08-28 LAB — CBC
HCT: 41.1 % (ref 39.0–52.0)
Hemoglobin: 12.9 g/dL — ABNORMAL LOW (ref 13.0–17.0)
MCH: 26.9 pg (ref 26.0–34.0)
MCHC: 31.4 g/dL (ref 30.0–36.0)
MCV: 85.8 fL (ref 80.0–100.0)
Platelets: 207 10*3/uL (ref 150–400)
RBC: 4.79 MIL/uL (ref 4.22–5.81)
RDW: 14.7 % (ref 11.5–15.5)
WBC: 7 10*3/uL (ref 4.0–10.5)
nRBC: 0 % (ref 0.0–0.2)

## 2019-08-28 LAB — BASIC METABOLIC PANEL
Anion gap: 8 (ref 5–15)
BUN: 17 mg/dL (ref 6–20)
CO2: 24 mmol/L (ref 22–32)
Calcium: 9.2 mg/dL (ref 8.9–10.3)
Chloride: 100 mmol/L (ref 98–111)
Creatinine, Ser: 1.29 mg/dL — ABNORMAL HIGH (ref 0.61–1.24)
GFR calc Af Amer: >60 mL/min (ref >60)
GFR calc non Af Amer: >60 mL/min (ref >60)
Glucose, Bld: 98 mg/dL (ref 70–99)
Potassium: 3.6 mmol/L (ref 3.5–5.1)
Sodium: 132 mmol/L — ABNORMAL LOW (ref 135–145)

## 2019-08-28 LAB — URINALYSIS, ROUTINE W REFLEX MICROSCOPIC
Bilirubin Urine: NEGATIVE
Glucose, UA: NEGATIVE mg/dL
Ketones, ur: 5 mg/dL — AB
Leukocytes,Ua: NEGATIVE
Nitrite: NEGATIVE
Protein, ur: 100 mg/dL — AB
RBC / HPF: >50 RBC/hpf — ABNORMAL HIGH (ref 0–5)
Specific Gravity, Urine: 1.018 (ref 1.005–1.030)
pH: 6 (ref 5.0–8.0)

## 2019-08-28 NOTE — ED Triage Notes (Signed)
Pt reporting that he has had a lot of blood in his since around 1700 today. Reports that last night the pajama pants he was wearing last night caused him to have pain in his right testicle area and right groin area since he woke up this morning. All of the pain subsided after urinating at 1700 today. Denies testicular swelling.

## 2019-08-28 NOTE — ED Notes (Signed)
Pt ambulatory from triage to room. Pt refused wheelchair. Pt instructed to get dressed into a gown and given warm blankets.

## 2019-08-28 NOTE — ED Triage Notes (Signed)
Pt returning from the restroom reporting his pain is returning and he is in fact having swelling to the right testicle.

## 2019-08-29 ENCOUNTER — Encounter (HOSPITAL_COMMUNITY): Payer: Self-pay | Admitting: Emergency Medicine

## 2019-08-29 ENCOUNTER — Telehealth: Payer: Self-pay

## 2019-08-29 DIAGNOSIS — R109 Unspecified abdominal pain: Secondary | ICD-10-CM | POA: Diagnosis not present

## 2019-08-29 MED ORDER — TAMSULOSIN HCL 0.4 MG PO CAPS: 0.4000 mg | ORAL_CAPSULE | Freq: Every day | ORAL | 0 refills | Status: DC

## 2019-08-29 MED ORDER — DICLOFENAC SODIUM ER 100 MG PO TB24: 100.0000 mg | ORAL_TABLET | Freq: Every day | ORAL | 0 refills | Status: DC

## 2019-08-29 MED ORDER — KETOROLAC TROMETHAMINE 60 MG/2ML IM SOLN
60.0000 mg | Freq: Once | INTRAMUSCULAR | Status: AC
  Administered 2019-08-29: 60 mg via INTRAMUSCULAR
  Filled 2019-08-29: qty 2

## 2019-08-29 MED ORDER — TAMSULOSIN HCL 0.4 MG PO CAPS
0.4000 mg | ORAL_CAPSULE | ORAL | Status: AC
  Administered 2019-08-29: 0.4 mg via ORAL
  Filled 2019-08-29: qty 1

## 2019-08-29 MED ORDER — ONDANSETRON 8 MG PO TBDP
8.0000 mg | ORAL_TABLET | Freq: Once | ORAL | Status: AC
  Administered 2019-08-29: 8 mg via ORAL
  Filled 2019-08-29: qty 1

## 2019-08-29 MED ORDER — ONDANSETRON 8 MG PO TBDP: ORAL_TABLET | ORAL | 0 refills | Status: DC

## 2019-08-29 NOTE — Telephone Encounter (Signed)
ER note reviewed

## 2019-08-29 NOTE — ED Provider Notes (Signed)
[facility] DEPT Provider Note   CSN: [id number] Arrival date & time: 08/28/19  1324     History   Chief Complaint Chief Complaint  Patient presents with   Hematuria    HPI Willie CAPILI Sr. is a 43 y.o. male.      Hematuria This is a new problem. The current episode started 12 to 24 hours ago. The problem occurs constantly. The problem has not changed since onset.Pertinent negatives include no chest pain, no abdominal pain, no headaches and no shortness of breath. Nothing aggravates the symptoms. Nothing relieves the symptoms. He has tried nothing for the symptoms. The treatment provided no relief.  Also endorses testicular pain last night. Then pain free.  At the time of exam was having low back pain.  No f/c/r. No n/v/d.    Past Medical History:  Diagnosis Date   Acid reflux    Chicken pox    Lead poisoning    Meningitis, viral 1995    Patient Active Problem List   Diagnosis Date Noted   GERD (gastroesophageal reflux disease) 08/20/2018   Mood disorder ([place]) 08/20/2018   Seasonal allergic rhinitis 03/02/2017    Past Surgical History:  Procedure Laterality Date   ESOPHAGOGASTRODUODENOSCOPY  2013   WISDOM TOOTH EXTRACTION          Home Medications    Prior to Admission medications   Medication Sig Start Date End Date Taking? Authorizing Provider  fluticasone (FLONASE) 50 MCG/ACT nasal spray Place 1 spray into both nostrils 2 (two) times daily. 03/02/17  Yes Pleas Koch, NP  loratadine-pseudoephedrine (CLARITIN-D 12-HOUR) 5-120 MG tablet Take 1 tablet by mouth 2 (two) times daily.   Yes [provider]    Family History Family History  Problem Relation Age of Onset   Prostate cancer Father    Cancer Mother    Prostate cancer Paternal Grandfather    Cancer Paternal Grandfather    Prostate cancer Paternal Uncle    Colon cancer Neg Hx     Social History Social History   Tobacco Use    Smoking status: Never Smoker   Smokeless tobacco: Never Used  Substance Use Topics   Alcohol use: Yes    Alcohol/week: 1.0 standard drinks    Types: 1 Cans of beer per week    Comment: stopped drinking daily, may drink once every 3 weeks   Drug use: No     Allergies   Patient has no known allergies.   Review of Systems Review of Systems  Constitutional: Negative for fever.  HENT: Negative for congestion.   Eyes: Negative for visual disturbance.  Respiratory: Negative for cough and shortness of breath.   Cardiovascular: Negative for chest pain.  Gastrointestinal: Negative for abdominal pain and nausea.  Genitourinary: Positive for hematuria and testicular pain.  Musculoskeletal: Negative for arthralgias.  Neurological: Negative for headaches.  Psychiatric/Behavioral: Negative for agitation.  All other systems reviewed and are negative.    Physical Exam Updated Vital Signs BP (!) 139/94    Pulse 80    Temp 98.5 F (36.9 C) (Oral)    Resp 15    Ht 6\' 3"  (1.905 m)    Wt 76.2 kg    SpO2 98%    BMI 21.00 kg/m   Physical Exam Vitals signs and nursing note reviewed.  Constitutional:      General: He is not in acute distress.    Appearance: He is normal weight.  HENT:  Head: Normocephalic and atraumatic.     Nose: Nose normal.  Eyes:     Conjunctiva/sclera: Conjunctivae normal.     Pupils: Pupils are equal, round, and reactive to light.  Neck:     Musculoskeletal: Normal range of motion and neck supple.  Cardiovascular:     Rate and Rhythm: Normal rate and regular rhythm.     Pulses: Normal pulses.     Heart sounds: Normal heart sounds.  Pulmonary:     Effort: Pulmonary effort is normal.     Breath sounds: Normal breath sounds.  Abdominal:     General: Abdomen is flat. Bowel sounds are normal.     Tenderness: There is no abdominal tenderness. There is no guarding or rebound.  Musculoskeletal: Normal range of motion.  Skin:    General: Skin is warm and dry.       Capillary Refill: Capillary refill takes less than 2 seconds.  Neurological:     General: No focal deficit present.     Mental Status: He is alert and oriented to person, place, and time.  Psychiatric:        Mood and Affect: Mood normal.        Behavior: Behavior normal.      ED Treatments / Results  Labs (all labs ordered are listed, but only abnormal results are displayed) Results for orders placed or performed during the hospital encounter of 08/28/19  Urinalysis, Routine w reflex microscopic- may I&O cath if menses  Result Value Ref Range   Color, Urine BROWN (A) YELLOW   APPearance CLOUDY (A) CLEAR   Specific Gravity, Urine 1.018 1.005 - 1.030   pH 6.0 5.0 - 8.0   Glucose, UA NEGATIVE NEGATIVE mg/dL   Hgb urine dipstick LARGE (A) NEGATIVE   Bilirubin Urine NEGATIVE NEGATIVE   Ketones, ur 5 (A) NEGATIVE mg/dL   Protein, ur 161100 (A) NEGATIVE mg/dL   Nitrite NEGATIVE NEGATIVE   Leukocytes,Ua NEGATIVE NEGATIVE   RBC / HPF >50 (H) 0 - 5 RBC/hpf   WBC, UA 6-10 0 - 5 WBC/hpf   Bacteria, UA FEW (A) NONE SEEN   Mucus PRESENT   Basic metabolic panel  Result Value Ref Range   Sodium 132 (L) 135 - 145 mmol/L   Potassium 3.6 3.5 - 5.1 mmol/L   Chloride 100 98 - 111 mmol/L   CO2 24 22 - 32 mmol/L   Glucose, Bld 98 70 - 99 mg/dL   BUN 17 6 - 20 mg/dL   Creatinine, Ser 0.961.29 (H) 0.61 - 1.24 mg/dL   Calcium 9.2 8.9 - 04.510.3 mg/dL   GFR calc non Af Amer >60 >60 mL/min   GFR calc Af Amer >60 >60 mL/min   Anion gap 8 5 - 15  CBC  Result Value Ref Range   WBC 7.0 4.0 - 10.5 K/uL   RBC 4.79 4.22 - 5.81 MIL/uL   Hemoglobin 12.9 (L) 13.0 - 17.0 g/dL   HCT 40.941.1 81.139.0 - 91.452.0 %   MCV 85.8 80.0 - 100.0 fL   MCH 26.9 26.0 - 34.0 pg   MCHC 31.4 30.0 - 36.0 g/dL   RDW 78.214.7 95.611.5 - 21.315.5 %   Platelets 207 150 - 400 K/uL   nRBC 0.0 0.0 - 0.2 %   Ct Renal Stone Study  Result Date: 08/29/2019 CLINICAL DATA:  Right flank pain for 1 day, hematuria and right testicular swelling EXAM: CT  ABDOMEN AND PELVIS WITHOUT CONTRAST TECHNIQUE: Multidetector CT imaging of the abdomen  and pelvis was performed following the standard protocol without IV contrast. COMPARISON:  CT June 06, 2014 FINDINGS: Lower chest: Lung bases are clear. Normal heart size. No pericardial effusion. Hepatobiliary: No focal liver abnormality is seen. No gallstones, gallbladder wall thickening, or biliary dilatation. Pancreas: Unremarkable. No pancreatic ductal dilatation or surrounding inflammatory changes. Spleen: Normal in size without focal abnormality. Adrenals/Urinary Tract: Normal adrenal glands. Asymmetric right renal enlargement with moderate right hydroureteronephrosis to the level of a 4 mm calculus in the distal right ureter approximately 2 cm proximal to the right ureterovesicular junction. No other urolithiasis. No visible or contour deforming renal lesions. No gross bladder abnormality however the urinary bladder is largely decompressed at the time of exam and therefore poorly evaluated by CT imaging. Stomach/Bowel: Distal esophagus, stomach and duodenal sweep are unremarkable. No small bowel wall thickening or dilatation. No evidence of obstruction. A normal appendix is visualized. Scattered colonic diverticula without focal pericolonic inflammation to suggest diverticulitis. No colonic dilatation or wall thickening. Vascular/Lymphatic: The aorta is normal caliber. No suspicious or enlarged lymph nodes in the included lymphatic chains. Reproductive: The prostate and seminal vesicles are unremarkable. No asymmetric testicular swelling or other visible abnormality of the external genitalia. Other: No abdominopelvic free fluid or free gas. No bowel containing hernias. Musculoskeletal: Stable sclerosis in the right iliac wing. 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 No acute osseous abnormality or suspicious osseous lesion. Schmorl's node inferior endplate L3. IMPRESSION: Obstructing 4 mm calculus in the distal right ureter,  approximately 2 cm proximal to the right ureterovesicular junction, with associated moderate right hydroureteronephrosis. No asymmetric testicular swelling or CT evident abnormality of the external genitalia. Electronically Signed   By: Kreg Shropshire M.D.   On: 08/29/2019 00:13    Radiology Ct Renal Stone Study  Result Date: 08/29/2019 CLINICAL DATA:  Right flank pain for 1 day, hematuria and right testicular swelling EXAM: CT ABDOMEN AND PELVIS WITHOUT CONTRAST TECHNIQUE: Multidetector CT imaging of the abdomen and pelvis was performed following the standard protocol without IV contrast. COMPARISON:  CT June 06, 2014 FINDINGS: Lower chest: Lung bases are clear. Normal heart size. No pericardial effusion. Hepatobiliary: No focal liver abnormality is seen. No gallstones, gallbladder wall thickening, or biliary dilatation. Pancreas: Unremarkable. No pancreatic ductal dilatation or surrounding inflammatory changes. Spleen: Normal in size without focal abnormality. Adrenals/Urinary Tract: Normal adrenal glands. Asymmetric right renal enlargement with moderate right hydroureteronephrosis to the level of a 4 mm calculus in the distal right ureter approximately 2 cm proximal to the right ureterovesicular junction. No other urolithiasis. No visible or contour deforming renal lesions. No gross bladder abnormality however the urinary bladder is largely decompressed at the time of exam and therefore poorly evaluated by CT imaging. Stomach/Bowel: Distal esophagus, stomach and duodenal sweep are unremarkable. No small bowel wall thickening or dilatation. No evidence of obstruction. A normal appendix is visualized. Scattered colonic diverticula without focal pericolonic inflammation to suggest diverticulitis. No colonic dilatation or wall thickening. Vascular/Lymphatic: The aorta is normal caliber. No suspicious or enlarged lymph nodes in the included lymphatic chains. Reproductive: The prostate and seminal vesicles are  unremarkable. No asymmetric testicular swelling or other visible abnormality of the external genitalia. Other: No abdominopelvic free fluid or free gas. No bowel containing hernias. Musculoskeletal: Stable sclerosis in the right iliac wing. 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 No acute osseous abnormality or suspicious osseous lesion. Schmorl's node inferior endplate L3. IMPRESSION: Obstructing 4 mm calculus in the distal right ureter, approximately 2 cm proximal to the right ureterovesicular junction, with  associated moderate right hydroureteronephrosis. No asymmetric testicular swelling or CT evident abnormality of the external genitalia. Electronically Signed   By: Kreg Shropshire M.D.   On: 08/29/2019 00:13    Procedures Procedures (including critical care time)  Medications Ordered in ED Medications  ketorolac (TORADOL) injection 60 mg (60 mg Intramuscular Given 08/29/19 0036)  tamsulosin (FLOMAX) capsule 0.4 mg (0.4 mg Oral Given 08/29/19 0036)  ondansetron (ZOFRAN-ODT) disintegrating tablet 8 mg (8 mg Oral Given 08/29/19 0037)     Initial Impression / Assessment and Plan / ED Course  Pain free most medications.  Resting comfortably.  Given a strainer and instruction on straining all urine and need to follow up with urology in one week.    GAYLIN BULTHUIS Sr. was evaluated in Emergency Department on 08/29/2019 for the symptoms described in the history of present illness. He was evaluated in the context of the global COVID-19 pandemic, which necessitated consideration that the patient might be at risk for infection with the SARS-CoV-2 virus that causes COVID-19. Institutional protocols and algorithms that pertain to the evaluation of patients at risk for COVID-19 are in a state of rapid change based on information released by regulatory bodies including the CDC and federal and state organizations. These policies and algorithms were followed during the patient's care in the ED.   Final Clinical  Impressions(s) / ED Diagnoses   Final diagnoses:  Kidney stone    Return for weakness, numbness, changes in vision or speech, fevers >100.4 unrelieved by medication, shortness of breath, intractable vomiting, or diarrhea, abdominal pain, Inability to tolerate liquids or food, cough, altered mental status or any concerns. No signs of systemic illness or infection. The patient is nontoxic-appearing on exam and vital signs are within normal limits.   I have reviewed the triage vital signs and the nursing notes. Pertinent labs &imaging results that were available during my care of the patient were reviewed by me and considered in my medical decision making (see chart for details).  After history, exam, and medical workup I feel the patient has been appropriately medically screened and is safe for discharge home. Pertinent diagnoses were discussed with the patient. Patient was given return precautions.      Scotti Motter, MD 08/29/19 [phone]

## 2019-08-29 NOTE — Telephone Encounter (Signed)
Per chart review tab pt went to [facility] ED on 08/28/19.

## 2019-08-29 NOTE — Telephone Encounter (Signed)
[facility] - Client TELEPHONE ADVICE RECORD [facility] Patient Name: Willie Farmer Gender: Male DOB: [birth year] Age: 43 Y 11 M 17 D Return Phone Number: [phone] (Primary), [phone] (Secondary) [place] [address] Client [facility] - Client Client Site [facility] Physician Webb Silversmith - NP Contact Type Call Who Is Calling Patient / Member / Family / Caregiver Call Type Triage / Clinical Relationship To Patient Self Return Phone Number [phone] (Primary) Chief Complaint TESTICULAR - sudden pain Reason for Call Symptomatic / Request for [place] states that he urinated blood and has had a sharp pain below belly button to the right. He was wearing pajamas last night and thinks he pulled them over his testicles and has been having sharp pains (8/9) in testicles ever since Translation No Nurse Assessment Nurse: Aris Lot, RN, Christina Date/Time (Eastern Time): 08/28/2019 6:20:47 PM Confirm and document reason for call. If symptomatic, describe symptoms. ---Caller stated that he is urinating blood and has pain in his testicles and abdomen. No other symptoms Has the patient had close contact with a person known or suspected to have the novel coronavirus illness OR traveled / lives in area with major community spread (including international travel) in the last 14 days from the onset of symptoms? * If Asymptomatic, screen for exposure and travel within the last 14 days. ---No Does the patient have any new or worsening symptoms? ---Yes Will a triage be completed? ---Yes Related visit to physician within the last 2 weeks? ---No Does the PT have any chronic conditions? (i.e. diabetes, asthma, this includes High risk factors for pregnancy, etc.) ---No Is this a behavioral health or substance abuse call?  ---No Guidelines Guideline Title Affirmed Question Affirmed Notes Nurse Date/Time Eilene Ghazi Time) Scrotal Pain Swollen scrotum Aris Lot, RN, Christina 08/28/2019 6:22:26 PM Disp. Time Eilene Ghazi Time) Disposition Final User 08/28/2019 6:18:43 PM Send to Urgent Zorita Pang 08/28/2019 6:24:56 PM Go to ED Now Yes Aris Lot, RN, Christina PLEASE NOTE: All timestamps contained within this report are represented as [place] Standard Time. CONFIDENTIALTY NOTICE: This fax transmission is intended only for the addressee. It contains information that is legally privileged, confidential or otherwise protected from use or disclosure. If you are not the intended recipient, you are strictly prohibited from reviewing, disclosing, copying using or disseminating any of this information or taking any action in reliance on or regarding this information. If you have received this fax in error, please notify us immediately by telephone so that we can arrange for its return to [place]. Phone: [phone], Toll-Free: [phone], Fax: [phone] Page: 2 of 2 Call Id: [id number] [place] Disagree/Comply Comply Caller Understands Yes PreDisposition InappropriateToAsk Care Advice Given Per Guideline GO TO ED NOW: * You need to be seen in the Emergency Department. * Go to the ED at ___________ [place] now. Drive carefully. NOTHING BY MOUTH: Do not eat or drink anything for now. (Reason: condition may need surgery and general anesthesia.) BRING MEDICINES: * Please bring a list of your current medicines when you go to the Emergency Department (ER). * It is also a good idea to bring the pill bottles too. This will help the doctor to make certain you are taking the right medicines and the right dose. CARE ADVICE given per Scrotal Pain (Adult) guideline. Referrals [facility] - ED

## 2019-09-05 DIAGNOSIS — N132 Hydronephrosis with renal and ureteral calculous obstruction: Secondary | ICD-10-CM | POA: Diagnosis not present

## 2019-09-05 DIAGNOSIS — N201 Calculus of ureter: Secondary | ICD-10-CM | POA: Diagnosis not present

## 2019-09-19 DIAGNOSIS — N201 Calculus of ureter: Secondary | ICD-10-CM | POA: Diagnosis not present

## 2019-09-24 ENCOUNTER — Other Ambulatory Visit: Payer: Self-pay | Admitting: Urology

## 2019-09-24 ENCOUNTER — Other Ambulatory Visit (HOSPITAL_COMMUNITY)
Admission: RE | Admit: 2019-09-24 | Discharge: 2019-09-24 | Disposition: A | Discharge status: Home / Self Care | Payer: BC Managed Care – PPO | Source: Ambulatory Visit | Attending: Urology | Admitting: Urology

## 2019-09-24 DIAGNOSIS — Z01812 Encounter for preprocedural laboratory examination: Secondary | ICD-10-CM | POA: Diagnosis not present

## 2019-09-24 DIAGNOSIS — Z20828 Contact with and (suspected) exposure to other viral communicable diseases: Secondary | ICD-10-CM | POA: Diagnosis not present

## 2019-09-26 LAB — NOVEL CORONAVIRUS, NAA (HOSP ORDER, SEND-OUT TO REF LAB; TAT 18-24 HRS): SARS-CoV-2, NAA: NOT DETECTED

## 2019-10-03 ENCOUNTER — Other Ambulatory Visit: Payer: Self-pay

## 2019-10-03 ENCOUNTER — Encounter (HOSPITAL_BASED_OUTPATIENT_CLINIC_OR_DEPARTMENT_OTHER): Payer: Self-pay | Admitting: *Deleted

## 2019-10-03 ENCOUNTER — Other Ambulatory Visit (HOSPITAL_COMMUNITY)
Admission: RE | Admit: 2019-10-03 | Discharge: 2019-10-03 | Disposition: A | Discharge status: Home / Self Care | Payer: BC Managed Care – PPO | Source: Ambulatory Visit | Attending: Urology | Admitting: Urology

## 2019-10-03 DIAGNOSIS — Z20828 Contact with and (suspected) exposure to other viral communicable diseases: Secondary | ICD-10-CM | POA: Insufficient documentation

## 2019-10-03 DIAGNOSIS — Z01812 Encounter for preprocedural laboratory examination: Secondary | ICD-10-CM | POA: Insufficient documentation

## 2019-10-03 NOTE — Progress Notes (Signed)
Spoke w/ via phone for pre-op interview--- PT Lab needs dos---- None COVID test ------  10-03-2019 Arrive at -------  0530 NPO after ------ [place] Medications to take morning of surgery ----- Flomax, Claritin w/ sips of water Diabetic medication -----n/a Patient Special Instructions ----- n/a Pre-Op special Istructions ----- n/a Patient verbalized understanding of instructions that were given at this phone interview. Patient denies shortness of breath, chest pain, fever, cough a this phone interview.

## 2019-10-04 ENCOUNTER — Encounter (HOSPITAL_BASED_OUTPATIENT_CLINIC_OR_DEPARTMENT_OTHER): Payer: Self-pay | Admitting: Anesthesiology

## 2019-10-04 LAB — NOVEL CORONAVIRUS, NAA (HOSP ORDER, SEND-OUT TO REF LAB; TAT 18-24 HRS): SARS-CoV-2, NAA: NOT DETECTED

## 2019-10-04 NOTE — Anesthesia Preprocedure Evaluation (Addendum)
Anesthesia Evaluation  Patient identified by MRN, date of birth, ID band Patient awake    Reviewed: Allergy & Precautions, NPO status , Patient's Chart, lab work & pertinent test results  Airway Mallampati: III  TM Distance: <3 FB Neck ROM: Full    Dental  (+) Teeth Intact, Dental Advisory Given   Pulmonary neg pulmonary ROS,    breath sounds clear to auscultation       Cardiovascular negative cardio ROS   Rhythm:Regular Rate:Normal     Neuro/Psych negative neurological ROS     GI/Hepatic Neg liver ROS, GERD  ,  Endo/Other  negative endocrine ROS  Renal/GU negative Renal ROS     Musculoskeletal negative musculoskeletal ROS (+)   Abdominal Normal abdominal exam  (+)   Peds  Hematology negative hematology ROS (+)   Anesthesia Other Findings   Reproductive/Obstetrics                            Anesthesia Physical Anesthesia Plan  ASA: II  Anesthesia Plan: General   Post-op Pain Management:    Induction: Intravenous  PONV Risk Score and Plan: 3 and Ondansetron, Dexamethasone and Midazolam  Airway Management Planned: LMA  Additional Equipment: None  Intra-op Plan:   Post-operative Plan: Extubation in OR  Informed Consent: I have reviewed the patients History and Physical, chart, labs and discussed the procedure including the risks, benefits and alternatives for the proposed anesthesia with the patient or authorized representative who has indicated his/her understanding and acceptance.       Plan Discussed with: CRNA  Anesthesia Plan Comments:        Anesthesia Quick Evaluation

## 2019-10-06 NOTE — H&P (Signed)
HPI: Willie Farmer is a 43 year-old male with a right ureteral calculus.  The patient's stone was on his right side. He first noticed the symptoms 08/27/2019. This is not his first kidney stone. There is not a history of calculus disease in the family. He is not currently having flank pain, back pain, groin pain, nausea, vomiting, fever or chills. He does not have a burning sensation when he urinates. He has not caught a stone in his urine strainer since his symptoms began.   He has never had surgical treatment for calculi in the past. This condition would be considered of mild to moderate severity with no modifying factors or associated signs or symptoms other than as noted above.   09/05/19: He was seen in the emergency room on 08/28/19 with a 24 hour history of right flank pain with radiation into the right testicle. It was constant and not relieved by positional change. A CT scan was obtained which revealed a 4 mm stone in the distal right ureter about 2 cm above the UVJ with proximal hydronephrosis but no other right or left renal calculi.  Since he was seen in the emergency room he has not had any further hematuria although he did have some the day that he was seen in the ER. He also has not had any further pain. He has been straining his urine and has not seen his stone pass. He has been taking tamsulosin.   09/19/19: He returns today for follow-up of his right distal ureteral stone. He indicates he has not passed his stone. Is currently not having any pain.     ALLERGIES: No Allergies    MEDICATIONS: Loratadine     GU PSH: None     PSH Notes: No Surgical Problems   NON-GU PSH: None   GU PMH: Ureteral obstruction secondary to calculous (Acute), Right, I noted some hydronephrosis on his CT scan. He is not having any discomfort currently. - 09/05/2019 Renal calculus, Renal calculus, right - 2015 Ureteral calculus, Ureteral stone - 2015      PMH Notes: Calculus disease: A CT scan in  6/15 revealed a punctate stone in the right kidney.   NON-GU PMH: Encounter for general adult medical examination without abnormal findings, Encounter for preventive health examination - 2015 Personal history of other diseases of the digestive system, History of gastric ulcer - 2015, History of esophageal reflux, - 2015 Acute gastric ulcer with hemorrhage GERD    FAMILY HISTORY: 1 son - Son Prostate Cancer - Uncle, Grandfather prostate cancer in father - Father   SOCIAL HISTORY: Marital Status: Married Preferred Language: English; Ethnicity: Not Hispanic Or Latino; Race: Black or African American Current Smoking Status: Patient has never smoked.   Tobacco Use Assessment Completed: Used Tobacco in last 30 days? Does drink.  Drinks 1 caffeinated drink per day.     Notes: Never a smoker, Alcohol use, Married, Caffeine use   REVIEW OF SYSTEMS:    GU Review Male:   Patient denies frequent urination, hard to postpone urination, burning/ pain with urination, get up at night to urinate, leakage of urine, stream starts and stops, trouble starting your stream, have to strain to urinate , erection problems, and penile pain.  Gastrointestinal (Upper):   Patient denies nausea, vomiting, and indigestion/ heartburn.  Gastrointestinal (Lower):   Patient denies diarrhea and constipation.  Constitutional:   Patient denies fever, night sweats, weight loss, and fatigue.  Skin:   Patient denies skin rash/ lesion and itching.  Eyes:   Patient denies blurred vision and double vision.  Ears/ Nose/ Throat:   Patient denies sore throat and sinus problems.  Hematologic/Lymphatic:   Patient denies swollen glands and easy bruising.  Cardiovascular:   Patient denies leg swelling and chest pains.  Respiratory:   Patient denies cough and shortness of breath.  Endocrine:   Patient denies excessive thirst.  Musculoskeletal:   Patient denies back pain and joint pain.  Neurological:   Patient denies headaches and  dizziness.  Psychologic:   Patient denies depression and anxiety.   VITAL SIGNS:    Weight 163 lb / 73.94 kg  Height 75 in / 190.5 cm  BP 129/86 mmHg  Pulse 64 /min  BMI 20.4 kg/m   MULTI-SYSTEM PHYSICAL EXAMINATION:    Constitutional: Well-nourished. No physical deformities. Normally developed. Good grooming.  Neck: Neck symmetrical, not swollen. Normal tracheal position.  Respiratory: No labored breathing, no use of accessory muscles.   Cardiovascular: Normal temperature, normal extremity pulses, no swelling, no varicosities.  Lymphatic: No enlargement of neck, axillae, groin.  Skin: No paleness, no jaundice, no cyanosis. No lesion, no ulcer, no rash.  Neurologic / Psychiatric: Oriented to time, oriented to place, oriented to person. No depression, no anxiety, no agitation.  Gastrointestinal: No mass, no tenderness, no rigidity, non obese abdomen.  Eyes: Normal conjunctivae. Normal eyelids.  Ears, Nose, Mouth, and Throat: Left ear no scars, no lesions, no masses. Right ear no scars, no lesions, no masses. Nose no scars, no lesions, no masses. Normal hearing. Normal lips.  Musculoskeletal: Normal gait and station of head and neck.    PAST DATA REVIEWED:  Source Of History:  Patient  Records Review:   Previous Patient Records  X-Ray Review: KUB: Reviewed Films. Previous KUB images were reviewed and compared with today's study.    PROCEDURES:         KUB - [id number]  A single view of the abdomen is obtained.      Patient confirmed No Neulasta OnPro Device. Independent review of his KUB today reveals 2 calcifications in the pelvis. One is a phlebolith that is rounded located more distally. The 2nd is his distal ureteral stone that is unchanged in position.         Urinalysis Dipstick Dipstick Cont'd  Color: Yellow Bilirubin: Neg mg/dL  Appearance: Clear Ketones: Neg mg/dL  Specific Gravity: 7.017 Blood: Neg ery/uL  pH: 6.0 Protein: Neg mg/dL  Glucose: Neg mg/dL Urobilinogen:  0.2 mg/dL    Nitrites: Neg    Leukocyte Esterase: Neg leu/uL    ASSESSMENT/PLAN:      ICD-10 Details  1 GU:   Ureteral calculus - N20.1 Right, Stable -   We discussed the management of urinary stones. These options include observation, ureteroscopy, shockwave lithotripsy, and PCNL. We discussed which options are relevant to these particular stones. We discussed the natural history of stones as well as the complications of untreated stones and the impact on quality of life without treatment as well as with each of the above listed treatments. We also discussed the efficacy of each treatment in its ability to clear the stone burden. With any of these management options I discussed the signs and symptoms of infection and the need for emergent treatment should these be experienced. For each option we discussed the ability of each procedure to clear the patient of their stone burden.   For observation I described the risks which include but are not limited to silent renal damage, life-threatening infection,  need for emergent surgery, failure to pass stone, and pain.   For ureteroscopy I described the risks which include heart attack, stroke, pulmonary embolus, death, bleeding, infection, damage to contiguous structures, positioning injury, ureteral stricture, ureteral avulsion, ureteral injury, need for ureteral stent, inability to perform ureteroscopy, need for an interval procedure, inability to clear stone burden, stent discomfort and pain.   For shockwave lithotripsy I described the risks which include arrhythmia, kidney contusion, kidney hemorrhage, need for transfusion, long-term risk of diabetes or hypertension, back discomfort, flank ecchymosis, flank abrasion, inability to break up stone, inability to pass stone fragments, Steinstrasse, infection associated with obstructing stones, need for different surgical procedure, need for repeat shockwave lithotripsy, and death.   His stone is in the distal  right ureter. There is a 2nd calcification in the pelvis and I that I was about 95% sure I knew which 1 was his distal ureteral stone in which 1 was a phlebolith although ureteroscopy would be 100% sure I was treating the appropriate calcification. Because of that I have recommended proceeding with ureteroscopy. I have discussed the procedure with him in detail.   Due to non progression of his stone he will be scheduled for right ureteroscopy.

## 2019-10-07 ENCOUNTER — Ambulatory Visit (HOSPITAL_BASED_OUTPATIENT_CLINIC_OR_DEPARTMENT_OTHER): Payer: BC Managed Care – PPO | Admitting: Anesthesiology

## 2019-10-07 ENCOUNTER — Encounter (HOSPITAL_BASED_OUTPATIENT_CLINIC_OR_DEPARTMENT_OTHER): Payer: Self-pay

## 2019-10-07 ENCOUNTER — Encounter (HOSPITAL_BASED_OUTPATIENT_CLINIC_OR_DEPARTMENT_OTHER)
Admission: RE | Disposition: A | Discharge status: Home / Self Care | Payer: Self-pay | Source: Home / Self Care | Attending: Urology

## 2019-10-07 ENCOUNTER — Ambulatory Visit (HOSPITAL_BASED_OUTPATIENT_CLINIC_OR_DEPARTMENT_OTHER)
Admission: RE | Admit: 2019-10-07 | Discharge: 2019-10-07 | Disposition: A | Discharge status: Home / Self Care | Payer: BC Managed Care – PPO | Attending: Urology | Admitting: Urology

## 2019-10-07 ENCOUNTER — Ambulatory Visit (HOSPITAL_COMMUNITY): Payer: BC Managed Care – PPO

## 2019-10-07 DIAGNOSIS — K219 Gastro-esophageal reflux disease without esophagitis: Secondary | ICD-10-CM | POA: Diagnosis not present

## 2019-10-07 DIAGNOSIS — J302 Other seasonal allergic rhinitis: Secondary | ICD-10-CM | POA: Diagnosis not present

## 2019-10-07 DIAGNOSIS — N200 Calculus of kidney: Secondary | ICD-10-CM | POA: Diagnosis not present

## 2019-10-07 DIAGNOSIS — Z01818 Encounter for other preprocedural examination: Secondary | ICD-10-CM | POA: Diagnosis not present

## 2019-10-07 DIAGNOSIS — Z87442 Personal history of urinary calculi: Secondary | ICD-10-CM | POA: Insufficient documentation

## 2019-10-07 DIAGNOSIS — N201 Calculus of ureter: Secondary | ICD-10-CM | POA: Diagnosis not present

## 2019-10-07 DIAGNOSIS — F39 Unspecified mood [affective] disorder: Secondary | ICD-10-CM | POA: Diagnosis not present

## 2019-10-07 HISTORY — DX: Other seasonal allergic rhinitis: J30.2

## 2019-10-07 HISTORY — PX: URETEROSCOPY WITH HOLMIUM LASER LITHOTRIPSY: SHX6645

## 2019-10-07 HISTORY — DX: Calculus of ureter: N20.1

## 2019-10-07 HISTORY — DX: Presence of spectacles and contact lenses: Z97.3

## 2019-10-07 SURGERY — URETEROSCOPY, WITH LITHOTRIPSY USING HOLMIUM LASER
Anesthesia: General | Laterality: Right

## 2019-10-07 MED ORDER — PROPOFOL 10 MG/ML IV BOLUS
INTRAVENOUS | Status: DC | PRN
  Administered 2019-10-07: 200 mg via INTRAVENOUS

## 2019-10-07 MED ORDER — LIDOCAINE HCL (CARDIAC) PF 100 MG/5ML IV SOSY
PREFILLED_SYRINGE | INTRAVENOUS | Status: DC | PRN
  Administered 2019-10-07: 60 mg via INTRAVENOUS

## 2019-10-07 MED ORDER — MIDAZOLAM HCL 5 MG/5ML IJ SOLN
INTRAMUSCULAR | Status: DC | PRN
  Administered 2019-10-07: 2 mg via INTRAVENOUS

## 2019-10-07 MED ORDER — BELLADONNA ALKALOIDS-OPIUM 16.2-60 MG RE SUPP
RECTAL | Status: DC | PRN
  Administered 2019-10-07: 1 via RECTAL

## 2019-10-07 MED ORDER — PHENAZOPYRIDINE HCL 200 MG PO TABS
200.0000 mg | ORAL_TABLET | Freq: Three times a day (TID) | ORAL | Status: DC
  Administered 2019-10-07: 200 mg via ORAL
  Filled 2019-10-07: qty 1

## 2019-10-07 MED ORDER — SODIUM CHLORIDE 0.9 % IR SOLN
Status: DC | PRN
  Administered 2019-10-07: 3000 mL via INTRAVESICAL

## 2019-10-07 MED ORDER — OXYCODONE HCL 5 MG PO TABS
5.0000 mg | ORAL_TABLET | Freq: Once | ORAL | Status: AC | PRN
  Administered 2019-10-07: 5 mg via ORAL
  Filled 2019-10-07: qty 1

## 2019-10-07 MED ORDER — IOHEXOL 300 MG/ML  SOLN
INTRAMUSCULAR | Status: DC | PRN
  Administered 2019-10-07: 20 mL via URETHRAL

## 2019-10-07 MED ORDER — LACTATED RINGERS IV SOLN
INTRAVENOUS | Status: DC
  Administered 2019-10-07: 07:00:00 via INTRAVENOUS
  Filled 2019-10-07: qty 1000

## 2019-10-07 MED ORDER — CIPROFLOXACIN IN D5W 400 MG/200ML IV SOLN
INTRAVENOUS | Status: AC
  Filled 2019-10-07: qty 200

## 2019-10-07 MED ORDER — OXYCODONE HCL 5 MG/5ML PO SOLN
5.0000 mg | Freq: Once | ORAL | Status: AC | PRN
  Filled 2019-10-07: qty 5

## 2019-10-07 MED ORDER — PHENYLEPHRINE 40 MCG/ML (10ML) SYRINGE FOR IV PUSH (FOR BLOOD PRESSURE SUPPORT)
PREFILLED_SYRINGE | INTRAVENOUS | Status: DC | PRN
  Administered 2019-10-07: 120 ug via INTRAVENOUS
  Administered 2019-10-07 (×2): 80 ug via INTRAVENOUS

## 2019-10-07 MED ORDER — MEPERIDINE HCL 25 MG/ML IJ SOLN
6.2500 mg | INTRAMUSCULAR | Status: DC | PRN
  Filled 2019-10-07: qty 1

## 2019-10-07 MED ORDER — ACETAMINOPHEN 325 MG PO TABS
325.0000 mg | ORAL_TABLET | Freq: Once | ORAL | Status: DC | PRN
  Filled 2019-10-07: qty 2

## 2019-10-07 MED ORDER — FENTANYL CITRATE (PF) 100 MCG/2ML IJ SOLN
INTRAMUSCULAR | Status: AC
  Filled 2019-10-07: qty 2

## 2019-10-07 MED ORDER — HYDROCODONE-ACETAMINOPHEN 10-325 MG PO TABS: 1.0000 | ORAL_TABLET | ORAL | 0 refills | Status: DC | PRN

## 2019-10-07 MED ORDER — LACTATED RINGERS IV SOLN
INTRAVENOUS | Status: DC
  Administered 2019-10-07: 10:00:00 via INTRAVENOUS
  Filled 2019-10-07: qty 1000

## 2019-10-07 MED ORDER — FENTANYL CITRATE (PF) 100 MCG/2ML IJ SOLN
25.0000 ug | INTRAMUSCULAR | Status: DC | PRN
  Administered 2019-10-07: 25 ug via INTRAVENOUS
  Filled 2019-10-07: qty 1

## 2019-10-07 MED ORDER — ACETAMINOPHEN 10 MG/ML IV SOLN
1000.0000 mg | Freq: Once | INTRAVENOUS | Status: DC | PRN
  Filled 2019-10-07: qty 100

## 2019-10-07 MED ORDER — ONDANSETRON HCL 4 MG/2ML IJ SOLN
INTRAMUSCULAR | Status: AC
  Filled 2019-10-07: qty 2

## 2019-10-07 MED ORDER — PHENAZOPYRIDINE HCL 200 MG PO TABS: 200.0000 mg | ORAL_TABLET | Freq: Three times a day (TID) | ORAL | 0 refills | Status: DC | PRN

## 2019-10-07 MED ORDER — MIDAZOLAM HCL 2 MG/2ML IJ SOLN
INTRAMUSCULAR | Status: AC
  Filled 2019-10-07: qty 2

## 2019-10-07 MED ORDER — DEXAMETHASONE SODIUM PHOSPHATE 10 MG/ML IJ SOLN
INTRAMUSCULAR | Status: AC
  Filled 2019-10-07: qty 1

## 2019-10-07 MED ORDER — FENTANYL CITRATE (PF) 100 MCG/2ML IJ SOLN
INTRAMUSCULAR | Status: DC | PRN
  Administered 2019-10-07 (×2): 50 ug via INTRAVENOUS

## 2019-10-07 MED ORDER — PROMETHAZINE HCL 25 MG/ML IJ SOLN
6.2500 mg | INTRAMUSCULAR | Status: DC | PRN
  Filled 2019-10-07: qty 1

## 2019-10-07 MED ORDER — PROPOFOL 500 MG/50ML IV EMUL
INTRAVENOUS | Status: AC
  Filled 2019-10-07: qty 50

## 2019-10-07 MED ORDER — LIDOCAINE 2% (20 MG/ML) 5 ML SYRINGE
INTRAMUSCULAR | Status: AC
  Filled 2019-10-07: qty 5

## 2019-10-07 MED ORDER — DEXAMETHASONE SODIUM PHOSPHATE 4 MG/ML IJ SOLN
INTRAMUSCULAR | Status: DC | PRN
  Administered 2019-10-07: 10 mg via INTRAVENOUS

## 2019-10-07 MED ORDER — BELLADONNA ALKALOIDS-OPIUM 16.2-60 MG RE SUPP
RECTAL | Status: AC
  Filled 2019-10-07: qty 1

## 2019-10-07 MED ORDER — ONDANSETRON HCL 4 MG/2ML IJ SOLN
INTRAMUSCULAR | Status: DC | PRN
  Administered 2019-10-07: 4 mg via INTRAVENOUS

## 2019-10-07 MED ORDER — OXYCODONE HCL 5 MG PO TABS
ORAL_TABLET | ORAL | Status: AC
  Filled 2019-10-07: qty 1

## 2019-10-07 MED ORDER — CIPROFLOXACIN IN D5W 400 MG/200ML IV SOLN
400.0000 mg | Freq: Once | INTRAVENOUS | Status: AC
  Administered 2019-10-07: 400 mg via INTRAVENOUS
  Filled 2019-10-07: qty 200

## 2019-10-07 MED ORDER — PHENAZOPYRIDINE HCL 100 MG PO TABS
ORAL_TABLET | ORAL | Status: AC
  Filled 2019-10-07: qty 2

## 2019-10-07 MED ORDER — ACETAMINOPHEN 160 MG/5ML PO SOLN
325.0000 mg | Freq: Once | ORAL | Status: DC | PRN
  Filled 2019-10-07: qty 20.3

## 2019-10-07 SURGICAL SUPPLY — 26 items
BAG DRAIN URO-CYSTO SKYTR STRL (DRAIN) ×2 IMPLANT
BAG DRN UROCATH (DRAIN) ×1
BASKET ZERO TIP NITINOL 2.4FR (BASKET) ×1 IMPLANT
BSKT STON RTRVL ZERO TP 2.4FR (BASKET) ×1
CATH INTERMIT  6FR 70CM (CATHETERS) IMPLANT
CATH URET 5FR 28IN CONE TIP (BALLOONS)
CATH URET 5FR 70CM CONE TIP (BALLOONS) IMPLANT
CLOTH BEACON ORANGE TIMEOUT ST (SAFETY) ×2 IMPLANT
DRSG TEGADERM 2-3/8X2-3/4 SM (GAUZE/BANDAGES/DRESSINGS) ×1 IMPLANT
EXTRACTOR STONE 1.7FRX115CM (UROLOGICAL SUPPLIES) IMPLANT
FIBER LASER FLEXIVA 365 (UROLOGICAL SUPPLIES) IMPLANT
FIBER LASER TRAC TIP (UROLOGICAL SUPPLIES) IMPLANT
GLOVE BIO SURGEON STRL SZ8 (GLOVE) ×2 IMPLANT
GOWN STRL REUS W/TWL XL LVL3 (GOWN DISPOSABLE) ×2 IMPLANT
GUIDEWIRE ANG ZIPWIRE 038X150 (WIRE) IMPLANT
GUIDEWIRE STR DUAL SENSOR (WIRE) ×2 IMPLANT
IV NS IRRIG 3000ML ARTHROMATIC (IV SOLUTION) ×4 IMPLANT
KIT TURNOVER CYSTO (KITS) ×2 IMPLANT
MANIFOLD NEPTUNE II (INSTRUMENTS) IMPLANT
NS IRRIG 500ML POUR BTL (IV SOLUTION) ×2 IMPLANT
PACK CYSTO (CUSTOM PROCEDURE TRAY) ×2 IMPLANT
SHEATH URETERAL 12FRX28CM (UROLOGICAL SUPPLIES) ×1 IMPLANT
STENT POLARIS 6X26 (STENTS) ×1 IMPLANT
TUBE CONNECTING 12X1/4 (SUCTIONS) IMPLANT
TUBING UROLOGY SET (TUBING) IMPLANT
WATER STERILE IRR 3000ML UROMA (IV SOLUTION) IMPLANT

## 2019-10-07 NOTE — Op Note (Signed)
PATIENT:  Willie Living Sr.  PRE-OPERATIVE DIAGNOSIS:  right Ureteral calculus  POST-OPERATIVE DIAGNOSIS: Same  PROCEDURE:  1. Cystoscopy with right retrograde pyelogram including interpretation. 2. Right ureteroscopy and stone extraction. 3. Right ureteral stent placement 4. Fluoroscopy time less than 1 hour.  SURGEON: Claybon Jabs, MD  INDICATION: Willie Farmer is a 43 year old male with a right ureteral stone that has failed to progress on medical expulsive therapy.  He is brought to the operating room today for ureteroscopic management of his stone.  ANESTHESIA:  General  EBL:  Minimal  DRAINS: 5 French, 26 cm Polaris stent (with string)  SPECIMEN: Stone given to patient  DESCRIPTION OF PROCEDURE: The patient was taken to the major OR and placed on the table. General anesthesia was administered and then the patient was moved to the dorsal lithotomy position. The genitalia was sterilely prepped and draped. An official timeout was performed.  Initially the 32 French cystoscope with 30 lens was passed under direct vision into the bladder. The bladder was then fully inspected. It was noted be free of any tumors, stones or inflammatory lesions. Ureteral orifices were of normal configuration and position. A 6 French open-ended ureteral catheter was then passed through the cystoscope into the ureteral orifice in order to perform a right retrograde pyelogram.  A retrograde pyelogram was performed by injecting full-strength contrast up the right ureter under direct fluoroscopic control. It revealed a filling defect in the distal ureter consistent with the stone seen on the preoperative KUB. The remainder of the ureter was noted to be normal as was the intrarenal collecting system. I then passed a 0.038 inch floppy-tipped guidewire through the open ended catheter and into the area of the renal pelvis and this was left in place. The inner portion of a ureteral access sheath was then passed over  the guidewire to gently dilate the intramural ureter. I then proceeded with ureteroscopy.  A 6 French rigid ureteroscope was then passed under direct into the bladder and into the right orifice and up the ureter. The stone was not identified within the ureter as I passed the scope nearly up to the UPJ.  I therefore switched to the flexible, digital ureteroscope and removed the rigid ureteroscope.  I passed the flexible ureteroscope over the guidewire up into the kidney and then remove the guidewire.  I began systematic inspection of the upper pole calyceal system and found the stone had migrated up into the upper pole.  I therefore used a 0 tip nitinol basket and engage the stone.  I pulled this up against the tip of the ureteroscope and then easily backed the ureteroscope and stone down the ureter without any resistance.  I then reinserted the cystoscope and passed the guidewire up the ureter into the area the renal pelvis.  The stent was passed over the guidewire up to the renal pelvis and as the guidewire was removed good curl was noted in the renal pelvis.  The bladder was then drained and the cystoscope was removed.  The string was affixed to the dorsum of the penis.  The patient tolerated the procedure well no intraoperative complications.  PLAN OF CARE: Discharge to home after PACU  PATIENT DISPOSITION:  PACU - hemodynamically stable.

## 2019-10-07 NOTE — Discharge Instructions (Signed)

## 2019-10-07 NOTE — Anesthesia Postprocedure Evaluation (Signed)
Anesthesia Post Note  Patient: JUVON TEATER Sr.  Procedure(s) Performed: URETEROSCOPY / [place] (Right )     Patient location during evaluation: PACU Anesthesia Type: General Level of consciousness: awake and alert Pain management: pain level controlled Vital Signs Assessment: post-procedure vital signs reviewed and stable Respiratory status: spontaneous breathing, nonlabored ventilation, respiratory function stable and patient connected to nasal cannula oxygen Cardiovascular status: blood pressure returned to baseline and stable Postop Assessment: no apparent nausea or vomiting Anesthetic complications: no    Last Vitals:  Vitals:   10/07/19 0915 10/07/19 0917  BP: 122/78   Pulse: (!) 57 63  Resp: 12 18  Temp:    SpO2: 100% 100%    Last Pain:  Vitals:   10/07/19 0930  TempSrc:   PainSc: 0-No pain                 Effie Berkshire

## 2019-10-07 NOTE — Anesthesia Procedure Notes (Signed)
Procedure Name: LMA Insertion Date/Time: 10/07/2019 7:27 AM Performed by: Lieutenant Diego, CRNA Pre-anesthesia Checklist: Patient identified, Emergency Drugs available, Suction available and Patient being monitored Patient Re-evaluated:Patient Re-evaluated prior to induction Oxygen Delivery Method: Circle system utilized Preoxygenation: Pre-oxygenation with 100% oxygen Induction Type: IV induction Ventilation: Mask ventilation without difficulty LMA: LMA inserted LMA Size: 4.0 Number of attempts: 1 Placement Confirmation: positive ETCO2 and breath sounds checked- equal and bilateral Tube secured with: Tape Dental Injury: Teeth and Oropharynx as per pre-operative assessment

## 2019-10-07 NOTE — Transfer of Care (Signed)
Immediate Anesthesia Transfer of Care Note  Patient: Willie Farmer.  Procedure(s) Performed: URETEROSCOPY / STENT PLACEMENT STONE BASKET RETRIVAL (Right )  Patient Location: PACU  Anesthesia Type:General  Level of Consciousness: awake  Airway & Oxygen Therapy: Patient Spontanous Breathing and Patient connected to face mask oxygen  Post-op Assessment: Report given to RN and Post -op Vital signs reviewed and stable  Post vital signs: Reviewed and stable  Last Vitals:  Vitals Value Taken Time  BP    Temp    Pulse 80 10/07/19 0815  Resp 16 10/07/19 0815  SpO2 100 % 10/07/19 0815  Vitals shown include unvalidated device data.  Last Pain:  Vitals:   10/07/19 0619  TempSrc: Oral  PainSc: 0-No pain      Patients Stated Pain Goal: 4 (85/46/27 0350)  Complications: No apparent anesthesia complications

## 2019-10-08 ENCOUNTER — Encounter (HOSPITAL_BASED_OUTPATIENT_CLINIC_OR_DEPARTMENT_OTHER): Payer: Self-pay | Admitting: Urology

## 2019-10-15 DIAGNOSIS — R8271 Bacteriuria: Secondary | ICD-10-CM | POA: Diagnosis not present

## 2019-10-15 DIAGNOSIS — N201 Calculus of ureter: Secondary | ICD-10-CM | POA: Diagnosis not present

## 2019-11-25 DIAGNOSIS — N201 Calculus of ureter: Secondary | ICD-10-CM | POA: Diagnosis not present

## 2019-11-25 DIAGNOSIS — N132 Hydronephrosis with renal and ureteral calculous obstruction: Secondary | ICD-10-CM | POA: Diagnosis not present

## 2019-11-29 DIAGNOSIS — Z20822 Contact with and (suspected) exposure to covid-19: Secondary | ICD-10-CM | POA: Diagnosis not present

## 2019-11-29 DIAGNOSIS — R0981 Nasal congestion: Secondary | ICD-10-CM | POA: Diagnosis not present

## 2019-11-29 DIAGNOSIS — J029 Acute pharyngitis, unspecified: Secondary | ICD-10-CM | POA: Diagnosis not present

## 2020-02-26 ENCOUNTER — Encounter: Payer: Self-pay | Admitting: Family Medicine

## 2020-02-26 ENCOUNTER — Other Ambulatory Visit: Payer: Self-pay

## 2020-02-26 ENCOUNTER — Ambulatory Visit (INDEPENDENT_AMBULATORY_CARE_PROVIDER_SITE_OTHER): Payer: BC Managed Care – PPO | Admitting: Family Medicine

## 2020-02-26 DIAGNOSIS — K120 Recurrent oral aphthae: Secondary | ICD-10-CM | POA: Diagnosis not present

## 2020-02-26 MED ORDER — TRIAMCINOLONE ACETONIDE 0.1 % MT PSTE: 1.0000 "application " | PASTE | Freq: Two times a day (BID) | OROMUCOSAL | 0 refills | Status: DC

## 2020-02-26 NOTE — Patient Instructions (Addendum)
I do want you to schedule dentist appointment.  For canker sore, use steroid gel sent to pharmacy.  Ok to continue numbing medicine but back off listerine.  Continue daily dental care - tooth brush.  Let us know if not improving with this.   Canker Sores  Canker sores are small, painful sores that develop inside your mouth. You can get one or more canker sores on the inside of your lips or cheeks, on your tongue, or anywhere inside your mouth. Canker sores cannot be passed from person to person (are not contagious). These sores are different from the sores that you may get on the outside of your lips (cold sores or fever blisters). What are the causes? The cause of this condition is not known. The condition may be passed down from a parent (genetic). What increases the risk? This condition is more likely to develop in:  Women.  People in their teens or 73s.  Women who are having their menstrual period.  People who are under a lot of emotional stress.  People who do not get enough iron or B vitamins.  People who do not take care of their mouth and teeth (have poor oral hygiene).  People who have an injury inside the mouth, such as after having dental work or from chewing something hard. What are the signs or symptoms? Canker sores usually start as painful red bumps. Then they turn into small white, yellow, or gray sores that have red borders. The sores may be painful, and the pain may get worse when you eat or drink. Along with the canker sore, symptoms may also include:  Fever.  Fatigue.  Swollen lymph nodes in your neck. How is this diagnosed? This condition may be diagnosed based on your symptoms and an exam of the inside of your mouth. If you get canker sores often or if they are very bad, you may have tests, such as:  Blood tests to rule out possible causes.  Swabbing a fluid sample from the sore to be tested for infection.  Removing a small tissue sample from the sore  (biopsy) to test it for cancer. How is this treated? Most canker sores go away without treatment in about 1 week. Home care is usually the only treatment that you will need. Over-the-counter medicines can relieve discomfort. If you have severe canker sores, your health care provider may prescribe:  Numbing ointment to relieve pain. ? Do not use numbing gels or products containing benzocaine in children who are 62 years of age or younger.  Vitamins.  Steroid medicines. These may be given as pills, mouth rinses, or gels.  Antibiotic mouth rinse. Follow these instructions at home:   Apply, take, or use over-the-counter and prescription medicines only as told by your health care provider. These include vitamins and ointments.  If you were prescribed an antibiotic mouth rinse, use it as told by your health care provider. Do not stop using the antibiotic even if your condition improves.  Until the sores are healed: ? Do not drink coffee or citrus juices. ? Do not eat spicy or salty foods.  Use a mild, over-the-counter mouth rinse as recommended by your health care provider.  Practice good oral hygiene by: ? Flossing your teeth every day. ? Brushing your teeth with a soft toothbrush twice each day. Contact a health care provider if:  Your symptoms do not get better after 2 weeks.  You also have a fever or swollen glands in your neck.  You get canker sores often.  You have a canker sore that is getting larger.  You cannot eat or drink due to your canker sores. Summary  Canker sores are small, painful sores that develop inside your mouth.  Canker sores usually start as painful red bumps that turn into small white, yellow, or gray sores that have red borders.  The sores may be quite painful, and the pain may get worse when you eat or drink.  Most canker sores clear up without treatment in about 1 week. Home care is usually the only treatment that you will need. Over-the-counter  medicines can relieve discomfort. This information is not intended to replace advice given to you by your health care provider. Make sure you discuss any questions you have with your health care provider. Document Revised: 10/13/2017 Document Reviewed: 08/07/2017 Elsevier Patient Education  2020 [employer].

## 2020-02-26 NOTE — Progress Notes (Signed)
This visit was conducted in person.  BP 124/68 (BP Location: Left Arm, Patient Position: Sitting, Cuff Size: Normal)   Pulse 79   Temp 97.9 F (36.6 C) (Temporal)   Ht 6\' 3"  (1.905 m)   Wt 168 lb 8 oz (76.4 kg)   SpO2 99%   BMI 21.06 kg/m    CC: canker sore Subjective:    Patient ID: Sr., male    DOB: 09-24-1976, 44 y.o.   MRN: [id number]  HPI: Willie Privott. is a 44 y.o. male presenting on 02/26/2020 for Mouth Lesions (C/o canker sores.  Started 4 days ago. Thinks it is due to 2nd COVID vaccine on 02/20/20. )   4d h/o canker sore to mid upper lip that may have started after 2nd 04/21/20 vaccine. He feels it's enlarging. Also sore at bottom lip. Treating with listerine and canker sore numbing medicine. Has never had canker sores in the past. No other oral lesions, genital lesions, skin rash.   After initial vaccine had L sided numbness and sharp pain to L eye.  After second vaccine lost vision to L eye immediately afterwards (monitored for 1 hour after the shot) as well as some nausea and nosebleed.   neti pot has helped prevent sinus infections.      Relevant past medical, surgical, family and social history reviewed and updated as indicated. Interim medical history since our last visit reviewed. Allergies and medications reviewed and updated. Outpatient Medications Prior to Visit  Medication Sig Dispense Refill  . cetirizine-pseudoephedrine (ZYRTEC-D) 5-120 MG tablet Take 1 tablet by mouth daily.    [employer] HYDROcodone-acetaminophen (NORCO) 10-325 MG tablet Take 1-2 tablets by mouth every 4 (four) hours as needed for moderate pain. Maximum dose per 24 hours - 8 pills 20 tablet 0  . loratadine (CLARITIN) 10 MG tablet Take 10 mg by mouth daily.     . ondansetron (ZOFRAN ODT) 8 MG disintegrating tablet 8mg  ODT q8 hours prn nausea 8 tablet 0  . phenazopyridine (PYRIDIUM) 200 MG tablet Take 1 tablet (200 mg total) by mouth 3 (three) times daily as needed for pain. 20  tablet 0  . tamsulosin (FLOMAX) 0.4 MG CAPS capsule Take 1 capsule (0.4 mg total) by mouth daily. (Patient taking differently: Take 0.4 mg by mouth daily. ) 30 capsule 0   No facility-administered medications prior to visit.     Per HPI unless specifically indicated in ROS section below Review of Systems Objective:    BP 124/68 (BP Location: Left Arm, Patient Position: Sitting, Cuff Size: Normal)   Pulse 79   Temp 97.9 F (36.6 C) (Temporal)   Ht 6\' 3"  (1.905 m)   Wt 168 lb 8 oz (76.4 kg)   SpO2 99%   BMI 21.06 kg/m   Wt Readings from Last 3 Encounters:  02/26/20 168 lb 8 oz (76.4 kg)  10/07/19 169 lb 7 oz (76.9 kg)  08/28/19 168 lb (76.2 kg)    Physical Exam Vitals and nursing note reviewed.  Constitutional:      Appearance: Normal appearance. He is not ill-appearing.  HENT:     Head: Normocephalic and atraumatic.     Nose: Nose normal.     Mouth/Throat:     Lips: Pink.     Mouth: Mucous membranes are moist. Oral lesions present. No injury.     Dentition: Normal dentition. No dental tenderness.     Tongue: No lesions.     Palate: No mass.  Pharynx: Oropharynx is clear. Uvula midline. No pharyngeal swelling or posterior oropharyngeal erythema.     Comments:  Aphthous ulcer inner upper lip near base  Few gingival sores at base of lower gumline Neurological:     Mental Status: He is alert.       Results for orders placed or performed during the hospital encounter of 10/03/19  Novel Coronavirus, NAA (Hosp order, Send-out to Ref Lab; TAT 18-24 hrs   Specimen: Nasopharyngeal Swab; Respiratory  Result Value Ref Range   SARS-CoV-2, NAA NOT DETECTED NOT DETECTED   Coronavirus Source NASOPHARYNGEAL    Assessment & Plan:  This visit occurred during the SARS-CoV-2 public health emergency.  Safety protocols were in place, including screening questions prior to the visit, additional usage of staff PPE, and extensive cleaning of exam room while observing appropriate contact  time as indicated for disinfecting solutions.   He did have what sounds like marked initial reactions to covid vaccines however temporary and symptoms have resolved.  Problem List Items Addressed This Visit    Oral aphthous ulcer    Isolated sore to upper inner lip, also with gingival inflammation at lower gumline. Treat with TCI oral paste BID PRN. rec back off listerine use (may be slowing healing) and rec schedule dentist eval as overdue.           Meds ordered this encounter  Medications  . triamcinolone (KENALOG) 0.1 % paste    Sig: Use as directed 1 application in the mouth or throat 2 (two) times daily.    Dispense:  5 g    Refill:  0   No orders of the defined types were placed in this encounter.  Patient instructions: I do want you to schedule dentist appointment.  For canker sore, use steroid gel sent to pharmacy.  Ok to continue numbing medicine but back off listerine.  Continue daily dental care - tooth brush.  Let us know if not improving with this.   Follow up plan: Return if symptoms worsen or fail to improve.  Ria Bush, MD

## 2020-02-26 NOTE — Assessment & Plan Note (Signed)
Isolated sore to upper inner lip, also with gingival inflammation at lower gumline. Treat with TCI oral paste BID PRN. rec back off listerine use (may be slowing healing) and rec schedule dentist eval as overdue.

## 2020-04-10 ENCOUNTER — Telehealth (INDEPENDENT_AMBULATORY_CARE_PROVIDER_SITE_OTHER): Payer: BC Managed Care – PPO | Admitting: Primary Care

## 2020-04-10 ENCOUNTER — Encounter: Payer: Self-pay | Admitting: Primary Care

## 2020-04-10 DIAGNOSIS — J302 Other seasonal allergic rhinitis: Secondary | ICD-10-CM

## 2020-04-10 NOTE — Assessment & Plan Note (Signed)
Symptoms today representative of rebound nasal congestion and sinus pressure from abrupt withdrawal of chronic use of decongestant.  Discussed that Zyrtec-D should not be used more than 5 consecutive days. He was unaware of this.  Stop Zyrtec-D. Start plain Zyrtec and Flonase. Discussed that symptoms will improve over the next several days. He will update if not.

## 2020-04-10 NOTE — Patient Instructions (Signed)
Stop using Zyrtec-D.  Start plain Zyrtec and Flonase as discussed.  Symptoms will improve in a few days.  It was a pleasure to see you today!

## 2020-04-10 NOTE — Progress Notes (Signed)
Subjective:    Patient ID: Willie Living Sr., male    DOB: 03/12/1976, 44 y.o.   MRN: [id number]  HPI     [place] 44 y.o. male  MRN [id number]  Date of Birth: [birth year]  PCP: Jearld Fenton, NP  This service was provided via telemedicine. Phone Visit performed on 04/10/2020    Rationale for phone visit along with limitations reviewed. I discussed the limitations, risks, security and privacy concerns of performing a phone visit and the availability of in person appointments. I also discussed with the patient that there may be a patient responsible charge related to this service. Patient consented to telephone encounter.    Location of patient: Home Location of provider: Office at [employer] @ [facility] Name of referring provider: N/A   Names of persons and role in encounter: Provider: Pleas Koch, NP  Patient: Willie Living Sr.  Other: N/A   Time on call: 6 min - 02 sec   Subjective: Chief Complaint  Patient presents with  . Nasal Congestion     HPI:  Willie Farmer is a 44 year old male patient of [facility] with a history of seasonal allergic rhinitis, GERD, oral aphthous ulcer who presents today with a chief complaint of sinus pressure.  His pressure is located underneath the right eye. He also reports "constant" clear rhinorrhea for the last 24 hours. Symptoms began about 2 days ago.  He's been taking Zyrtec-D daily for months at a time, intermittently, for the last year. He suddenly stopped taking his Zyrtec-D a few days ago.  He denies fevers, chills, body aches, fatigue. He's not taken anything else OTC for treatment.   Objective/Observations:  Sounds congested via phone.   There were no vitals taken for this visit.   Respiratory status: speaks in complete sentences without evident shortness of breath.   Assessment/Plan:  No problem-specific Assessment & Plan notes found for this encounter.   I discussed the assessment and treatment  plan with the patient. The patient was provided an opportunity to ask questions and all were answered. The patient agreed with the plan and demonstrated an understanding of the instructions.  Lab Orders  No laboratory test(s) ordered today    No orders of the defined types were placed in this encounter.   The patient was advised to call back or seek an in-person evaluation if the symptoms worsen or if the condition fails to improve as anticipated.  Pleas Koch, NP    Review of Systems  Constitutional: Negative for fever.  HENT: Positive for congestion, rhinorrhea and sinus pressure.   Respiratory: Negative for cough.   Allergic/Immunologic: Positive for environmental allergies.       Past Medical History:  Diagnosis Date  . History of anal fissures 2015  . History of viral meningitis 1995  . Right ureteral stone   . Seasonal allergies   . Wears glasses      Social History   Socioeconomic History  . Marital status: Married    Spouse name: Not on file  . Number of children: 1  . Years of education: Not on file  . Highest education level: Not on file  Occupational History  . Occupation: Contractor: [employer]  Tobacco Use  . Smoking status: Never Smoker  . Smokeless tobacco: Never Used  Substance and Sexual Activity  . Alcohol use: Yes    Comment: occasional  . Drug use:  Never  . Sexual activity: Yes  Other Topics Concern  . Not on file  Social History Narrative  . Not on file   Social Determinants of Health   Financial Resource Strain:   . Difficulty of Paying Living Expenses:   Food Insecurity:   . Worried About Programme researcher, broadcasting/film/video in the Last Year:   . Barista in the Last Year:   Transportation Needs:   . Freight forwarder (Medical):   Marland Kitchen Lack of Transportation (Non-Medical):   Physical Activity:   . Days of Exercise per Week:   . Minutes of Exercise per Session:   Stress:   . Feeling of Stress :     Social Connections:   . Frequency of Communication with Friends and Family:   . Frequency of Social Gatherings with Friends and Family:   . Attends Religious Services:   . Active Member of Clubs or Organizations:   . Attends Banker Meetings:   Marland Kitchen Marital Status:   Intimate Partner Violence:   . Fear of Current or Ex-Partner:   . Emotionally Abused:   Marland Kitchen Physically Abused:   . Sexually Abused:     Past Surgical History:  Procedure Laterality Date  . ESOPHAGOGASTRODUODENOSCOPY  08/01/2012  . URETEROSCOPY WITH HOLMIUM LASER LITHOTRIPSY Right 10/07/2019   Procedure: URETEROSCOPY / STENT PLACEMENT STONE BASKET RETRIVAL;  Surgeon: Ihor Gully, MD;  Location: [facility];  Service: Urology;  Laterality: Right;  . WISDOM TOOTH EXTRACTION      Family History  Problem Relation Age of Onset  . Prostate cancer Father   . Cancer Mother   . Prostate cancer Paternal Grandfather   . Cancer Paternal Grandfather   . Prostate cancer Paternal Uncle   . Colon cancer Neg Hx     Allergies  Allergen Reactions  . Latex Rash    Current Outpatient Medications on File Prior to Visit  Medication Sig Dispense Refill  . triamcinolone (KENALOG) 0.1 % paste Use as directed 1 application in the mouth or throat 2 (two) times daily. 5 g 0  . cetirizine-pseudoephedrine (ZYRTEC-D) 5-120 MG tablet Take 1 tablet by mouth daily.     No current facility-administered medications on file prior to visit.    There were no vitals taken for this visit.   Objective:   Physical Exam  Constitutional: He is oriented to person, place, and time.  HENT:  Sounds nasally congested via phone  Respiratory: Effort normal.  Neurological: He is alert and oriented to person, place, and time.           Assessment & Plan:

## 2020-05-01 DIAGNOSIS — N201 Calculus of ureter: Secondary | ICD-10-CM | POA: Diagnosis not present

## 2020-05-01 DIAGNOSIS — R1084 Generalized abdominal pain: Secondary | ICD-10-CM | POA: Diagnosis not present

## 2020-05-02 DIAGNOSIS — R1030 Lower abdominal pain, unspecified: Secondary | ICD-10-CM | POA: Diagnosis not present

## 2020-06-23 ENCOUNTER — Ambulatory Visit
Admission: EM | Admit: 2020-06-23 | Discharge: 2020-06-23 | Disposition: A | Discharge status: Home / Self Care | Payer: BC Managed Care – PPO | Attending: Emergency Medicine | Admitting: Emergency Medicine

## 2020-06-23 DIAGNOSIS — B349 Viral infection, unspecified: Secondary | ICD-10-CM

## 2020-06-23 NOTE — Discharge Instructions (Signed)
Your COVID test is pending.  You should self quarantine until the test result is back.    Take Tylenol as needed for fever or discomfort.  Rest and keep yourself hydrated.    Go to the emergency department if you develop acute worsening symptoms.     

## 2020-06-23 NOTE — ED Provider Notes (Signed)
Willie Farmer    CSN: [id number] Arrival date & time: 06/23/20  1709      History   Chief Complaint Chief Complaint  Patient presents with  . Nasal Congestion  . Nausea    HPI Willie YOM Sr. is a 44 y.o. male.   Patient presents with 1-3 day history of nasal congestion, body aches, nausea, fever.  T-max 99.7.  Patient states he has treated his symptoms at home with a Nettie pot and Tylenol.  He denies sore throat, cough, shortness of breath, abdominal pain, vomiting, diarrhea, rash, or other symptoms.  Patient's son tested positive for COVID on 06/18/2020.  Patient took an at-home COVID test yesterday and it was negative.  He is fully vaccinated for COVID.   The history is provided by the patient.    Past Medical History:  Diagnosis Date  . History of anal fissures 2015  . History of viral meningitis 1995  . Right ureteral stone   . Seasonal allergies   . Wears glasses     Patient Active Problem List   Diagnosis Date Noted  . Oral aphthous ulcer 02/26/2020  . GERD (gastroesophageal reflux disease) 08/20/2018  . Mood disorder (HCC) 08/20/2018  . Seasonal allergic rhinitis 03/02/2017    Past Surgical History:  Procedure Laterality Date  . ESOPHAGOGASTRODUODENOSCOPY  08/01/2012  . URETEROSCOPY WITH HOLMIUM LASER LITHOTRIPSY Right 10/07/2019   Procedure: URETEROSCOPY / STENT PLACEMENT STONE BASKET RETRIVAL;  Surgeon: Ihor Gully, MD;  Location[facility];  Service: Urology;  Laterality: Right;  . WISDOM TOOTH EXTRACTION         Home Medications    Prior to Admission medications   Medication Sig Start Date End Date Taking? Authorizing Provider  cetirizine-pseudoephedrine (ZYRTEC-D) 5-120 MG tablet Take 1 tablet by mouth daily.    [provider]  triamcinolone (KENALOG) 0.1 % paste Use as directed 1 application in the mouth or throat 2 (two) times daily. 02/26/20   Eustaquio Boyden, MD    Family History Family History    Problem Relation Age of Onset  . Prostate cancer Father   . Cancer Mother   . Prostate cancer Paternal Grandfather   . Cancer Paternal Grandfather   . Prostate cancer Paternal Uncle   . Colon cancer Neg Hx     Social History Social History   Tobacco Use  . Smoking status: Never Smoker  . Smokeless tobacco: Never Used  Vaping Use  . Vaping Use: Never used  Substance Use Topics  . Alcohol use: Yes    Comment: occasional  . Drug use: Never     Allergies   Latex   Review of Systems Review of Systems  Constitutional: Negative for chills and fever.  HENT: Positive for congestion. Negative for ear pain and sore throat.   Eyes: Negative for pain and visual disturbance.  Respiratory: Negative for cough and shortness of breath.   Cardiovascular: Negative for chest pain and palpitations.  Gastrointestinal: Positive for nausea. Negative for abdominal pain, diarrhea and vomiting.  Genitourinary: Negative for dysuria and hematuria.  Musculoskeletal: Positive for arthralgias. Negative for back pain.  Skin: Negative for color change and rash.  Neurological: Negative for dizziness, seizures, syncope, weakness and numbness.  All other systems reviewed and are negative.    Physical Exam Triage Vital Signs ED Triage Vitals  Enc Vitals Group     BP 06/23/20 1722 (!) 143/87     Pulse Rate 06/23/20 1722 67  Resp 06/23/20 1722 16     Temp 06/23/20 1722 99.7 F (37.6 C)     Temp Source 06/23/20 1722 Oral     SpO2 06/23/20 1722 98 %     Weight --      Height --      Head Circumference --      Peak Flow --      Pain Score 06/23/20 1724 0     Pain Loc --      Pain Edu? --      Excl. in GC? --    No data found.  Updated Vital Signs BP (!) 143/87   Pulse 67   Temp 99.7 F (37.6 C) (Oral)   Resp 16   SpO2 98%   Visual Acuity Right Eye Distance:   Left Eye Distance:   Bilateral Distance:    Right Eye Near:   Left Eye Near:    Bilateral Near:     Physical  Exam Vitals and nursing note reviewed.  Constitutional:      General: He is not in acute distress.    Appearance: He is well-developed. He is not ill-appearing.  HENT:     Head: Normocephalic and atraumatic.     Right Ear: Tympanic membrane normal.     Left Ear: Tympanic membrane normal.     Nose: Nose normal.     Mouth/Throat:     Mouth: Mucous membranes are moist.     Pharynx: Oropharynx is clear.  Eyes:     Conjunctiva/sclera: Conjunctivae normal.  Cardiovascular:     Rate and Rhythm: Normal rate and regular rhythm.     Heart sounds: Normal heart sounds. No murmur heard.   Pulmonary:     Effort: Pulmonary effort is normal. No respiratory distress.     Breath sounds: Normal breath sounds. No wheezing or rhonchi.  Abdominal:     Palpations: Abdomen is soft.     Tenderness: There is no abdominal tenderness. There is no guarding or rebound.  Musculoskeletal:     Cervical back: Neck supple.     Right lower leg: No edema.     Left lower leg: No edema.  Skin:    General: Skin is warm and dry.     Findings: No rash.  Neurological:     General: No focal deficit present.     Mental Status: He is alert and oriented to person, place, and time.     Gait: Gait normal.  Psychiatric:        Mood and Affect: Mood normal.        Behavior: Behavior normal.      UC Treatments / Results  Labs (all labs ordered are listed, but only abnormal results are displayed) Labs Reviewed  NOVEL CORONAVIRUS, NAA    EKG   Radiology No results found.  Procedures Procedures (including critical care time)  Medications Ordered in UC Medications - No data to display  Initial Impression / Assessment and Plan / UC Course  I have reviewed the triage vital signs and the nursing notes.  Pertinent labs & imaging results that were available during my care of the patient were reviewed by me and considered in my medical decision making (see chart for details).   Viral illness.  PCR COVID  pending.  Instructed patient to self quarantine until the test result is back.  Discussed symptomatic treatment.  Instructed him to go to the ED if he has acute worsening symptoms.  Instructed him to follow-up  with his PCP if his symptoms are not improving.  Patient agrees to plan of care.   Final Clinical Impressions(s) / UC Diagnoses   Final diagnoses:  Viral illness     Discharge Instructions     Your COVID test is pending.  You should self quarantine until the test result is back.    Take Tylenol as needed for fever or discomfort.  Rest and keep yourself hydrated.    Go to the emergency department if you develop acute worsening symptoms.        ED Prescriptions    None     PDMP not reviewed this encounter.   Mickie Bail, NP 06/23/20 1749

## 2020-06-23 NOTE — ED Triage Notes (Signed)
Pt presents to UC for nasal congestion, relieved by benadryl and netti pot.   Pt also complaining of nausea, body aches, and subjective fevers.  Pt son covid +.   Pt states symptoms have been present x3 days.

## 2020-06-25 LAB — SARS-COV-2, NAA 2 DAY TAT

## 2020-06-25 LAB — NOVEL CORONAVIRUS, NAA: SARS-CoV-2, NAA: DETECTED — AB

## 2020-08-04 DIAGNOSIS — N201 Calculus of ureter: Secondary | ICD-10-CM | POA: Diagnosis not present

## 2021-03-03 ENCOUNTER — Ambulatory Visit
Admission: EM | Admit: 2021-03-03 | Discharge: 2021-03-03 | Disposition: A | Discharge status: Home / Self Care | Payer: BC Managed Care – PPO

## 2021-03-03 ENCOUNTER — Other Ambulatory Visit: Payer: Self-pay

## 2021-03-03 DIAGNOSIS — J069 Acute upper respiratory infection, unspecified: Secondary | ICD-10-CM | POA: Diagnosis not present

## 2021-03-03 NOTE — ED Triage Notes (Signed)
Pt presents with c/o sore throat and nasal congestion with head and eye pressure that began yesterday, sore throat improves throughout the day

## 2021-03-03 NOTE — Discharge Instructions (Signed)
Take ibuprofen as needed for fever or discomfort.  Take plain over-the-counter Mucinex as needed for congestion.    Follow up with your primary care provider if your symptoms are not improving.

## 2021-03-03 NOTE — ED Provider Notes (Signed)
Willie Farmer    CSN: [id number] Arrival date & time: 03/03/21  0539      History   Chief Complaint Chief Complaint  Patient presents with  . Sore Throat  . Nasal Congestion    HPI Willie HOARE Sr. is a 45 y.o. male.   Patient presents with 1 day history of scratchy throat, nasal congestion, and his pressure, nonproductive cough.  He denies fever, chills, rash, shortness of breath, diarrhea, or other symptoms.  No treatments attempted at home.  His medical history includes seasonal allergies, GERD, mood disorder.  The history is provided by the patient and medical records.    Past Medical History:  Diagnosis Date  . History of anal fissures 2015  . History of viral meningitis 1995  . Right ureteral stone   . Seasonal allergies   . Wears glasses     Patient Active Problem List   Diagnosis Date Noted  . Oral aphthous ulcer 02/26/2020  . GERD (gastroesophageal reflux disease) 08/20/2018  . Mood disorder (HCC) 08/20/2018  . Seasonal allergic rhinitis 03/02/2017    Past Surgical History:  Procedure Laterality Date  . ESOPHAGOGASTRODUODENOSCOPY  08/01/2012  . URETEROSCOPY WITH HOLMIUM LASER LITHOTRIPSY Right 10/07/2019   Procedure: URETEROSCOPY / STENT PLACEMENT STONE BASKET RETRIVAL;  Surgeon: Ihor Gully, MD;  Location: [facility];  Service: Urology;  Laterality: Right;  . WISDOM TOOTH EXTRACTION         Home Medications    Prior to Admission medications   Medication Sig Start Date End Date Taking? Authorizing Provider  cetirizine-pseudoephedrine (ZYRTEC-D) 5-120 MG tablet Take 1 tablet by mouth daily.    [provider]  triamcinolone (KENALOG) 0.1 % paste Use as directed 1 application in the mouth or throat 2 (two) times daily. 02/26/20   Eustaquio Boyden, MD    Family History Family History  Problem Relation Age of Onset  . Prostate cancer Father   . Cancer Mother   . Prostate cancer Paternal Grandfather   . Cancer  Paternal Grandfather   . Prostate cancer Paternal Uncle   . Colon cancer Neg Hx     Social History Social History   Tobacco Use  . Smoking status: Never Smoker  . Smokeless tobacco: Never Used  Vaping Use  . Vaping Use: Never used  Substance Use Topics  . Alcohol use: Yes    Comment: occasional  . Drug use: Never     Allergies   Latex   Review of Systems Review of Systems  Constitutional: Negative for chills and fever.  HENT: Positive for congestion, sinus pressure and sore throat. Negative for ear pain.   Eyes: Negative for pain and visual disturbance.  Respiratory: Positive for cough. Negative for shortness of breath.   Cardiovascular: Negative for chest pain and palpitations.  Gastrointestinal: Negative for abdominal pain and diarrhea.  Genitourinary: Negative for dysuria and hematuria.  Musculoskeletal: Negative for arthralgias and back pain.  Skin: Negative for color change and rash.  Neurological: Negative for seizures and syncope.  All other systems reviewed and are negative.    Physical Exam Triage Vital Signs ED Triage Vitals  Enc Vitals Group     BP      Pulse      Resp      Temp      Temp src      SpO2      Weight      Height      Head Circumference  Peak Flow      Pain Score      Pain Loc      Pain Edu?      Excl. in GC?    No data found.  Updated Vital Signs BP 134/90 (BP Location: Left Arm)   Pulse 71   Temp 98.7 F (37.1 C) (Oral)   Resp 18   SpO2 97%   Visual Acuity Right Eye Distance:   Left Eye Distance:   Bilateral Distance:    Right Eye Near:   Left Eye Near:    Bilateral Near:     Physical Exam Vitals and nursing note reviewed.  Constitutional:      General: He is not in acute distress.    Appearance: He is well-developed. He is not ill-appearing.  HENT:     Head: Normocephalic and atraumatic.     Right Ear: Tympanic membrane normal.     Left Ear: Tympanic membrane normal.     Nose: Nose normal.      Mouth/Throat:     Mouth: Mucous membranes are moist.     Pharynx: Oropharynx is clear.  Eyes:     Conjunctiva/sclera: Conjunctivae normal.  Cardiovascular:     Rate and Rhythm: Normal rate and regular rhythm.     Heart sounds: Normal heart sounds.  Pulmonary:     Effort: Pulmonary effort is normal. No respiratory distress.     Breath sounds: Normal breath sounds.  Abdominal:     Palpations: Abdomen is soft.     Tenderness: There is no abdominal tenderness.  Musculoskeletal:     Cervical back: Neck supple.  Skin:    General: Skin is warm and dry.  Neurological:     General: No focal deficit present.     Mental Status: He is alert and oriented to person, place, and time.     Gait: Gait normal.  Psychiatric:        Mood and Affect: Mood normal.        Behavior: Behavior normal.      UC Treatments / Results  Labs (all labs ordered are listed, but only abnormal results are displayed) Labs Reviewed - No data to display  EKG   Radiology No results found.  Procedures Procedures (including critical care time)  Medications Ordered in UC Medications - No data to display  Initial Impression / Assessment and Plan / UC Course  I have reviewed the triage vital signs and the nursing notes.  Pertinent labs & imaging results that were available during my care of the patient were reviewed by me and considered in my medical decision making (see chart for details).   Viral URI with cough.  Patient is well-appearing and his exam is reassuring.  He declines COVID test today.  Discussed symptomatic treatment including ibuprofen, plain Mucinex, rest, hydration.  Instructed patient to follow-up with his PCP if his symptoms are not improving.   Final Clinical Impressions(s) / UC Diagnoses   Final diagnoses:  Viral URI with cough     Discharge Instructions     Take ibuprofen as needed for fever or discomfort.  Take plain over-the-counter Mucinex as needed for congestion.     Follow up with your primary care provider if your symptoms are not improving.        ED Prescriptions    None     PDMP not reviewed this encounter.   Mickie Bail, NP 03/03/21 [phone]

## 2021-03-30 ENCOUNTER — Ambulatory Visit
Admission: EM | Admit: 2021-03-30 | Discharge: 2021-03-30 | Disposition: A | Discharge status: Home / Self Care | Payer: BC Managed Care – PPO | Attending: Emergency Medicine | Admitting: Emergency Medicine

## 2021-03-30 ENCOUNTER — Other Ambulatory Visit: Payer: Self-pay

## 2021-03-30 DIAGNOSIS — J029 Acute pharyngitis, unspecified: Secondary | ICD-10-CM

## 2021-03-30 DIAGNOSIS — Z20822 Contact with and (suspected) exposure to covid-19: Secondary | ICD-10-CM

## 2021-03-30 LAB — POCT RAPID STREP A (OFFICE): Rapid Strep A Screen: NEGATIVE

## 2021-03-30 NOTE — Discharge Instructions (Addendum)
Your rapid strep test is negative.  A throat culture is pending; we will call you if it is positive requiring treatment.    Your COVID test is pending.  You should self quarantine until the test result is back.    Take Tylenol or ibuprofen as needed for fever or discomfort.  Rest and keep yourself hydrated.    Follow-up with your primary care provider if your symptoms are not improving.     

## 2021-03-30 NOTE — ED Triage Notes (Signed)
Intake completed by provider °

## 2021-03-30 NOTE — ED Provider Notes (Signed)
[name]    CSN: [id number] Arrival date & time: 03/30/21  1010      History   Chief Complaint Chief Complaint  Patient presents with  . Sore Throat    HPI Willie OSBOURNE Sr. is a 45 y.o. male.   Patient presents with sore throat x2 days.  He also reports mild headache since this morning.  He denies fever, chills, rash, cough, shortness of breath, vomiting, diarrhea, or other symptoms.  No treatments attempted at home.  Patient was seen here on 03/03/2021; diagnosed with viral URI with cough; treated symptomatically; patient declined COVID test at that time.  His medical history includes seasonal allergies, GERD, mood disorder.  The history is provided by the patient and medical records.    Past Medical History:  Diagnosis Date  . History of anal fissures 2015  . History of viral meningitis 1995  . Right ureteral stone   . Seasonal allergies   . Wears glasses     Patient Active Problem List   Diagnosis Date Noted  . Oral aphthous ulcer 02/26/2020  . GERD (gastroesophageal reflux disease) 08/20/2018  . Mood disorder (HCC) 08/20/2018  . Seasonal allergic rhinitis 03/02/2017    Past Surgical History:  Procedure Laterality Date  . ESOPHAGOGASTRODUODENOSCOPY  08/01/2012  . URETEROSCOPY WITH HOLMIUM LASER LITHOTRIPSY Right 10/07/2019   Procedure: URETEROSCOPY / STENT PLACEMENT STONE BASKET RETRIVAL;  Surgeon: Ihor Gully, MD;  Location: [facility];  Service: Urology;  Laterality: Right;  . WISDOM TOOTH EXTRACTION         Home Medications    Prior to Admission medications   Medication Sig Start Date End Date Taking? Authorizing Provider  cetirizine-pseudoephedrine (ZYRTEC-D) 5-120 MG tablet Take 1 tablet by mouth daily.    [provider]  triamcinolone (KENALOG) 0.1 % paste Use as directed 1 application in the mouth or throat 2 (two) times daily. 02/26/20   Eustaquio Boyden, MD    Family History Family History  Problem  Relation Age of Onset  . Prostate cancer Father   . Cancer Mother   . Prostate cancer Paternal Grandfather   . Cancer Paternal Grandfather   . Prostate cancer Paternal Uncle   . Colon cancer Neg Hx     Social History Social History   Tobacco Use  . Smoking status: Never Smoker  . Smokeless tobacco: Never Used  Vaping Use  . Vaping Use: Never used  Substance Use Topics  . Alcohol use: Yes    Comment: occasional  . Drug use: Never     Allergies   Latex   Review of Systems Review of Systems  Constitutional: Negative for chills and fever.  HENT: Positive for sore throat. Negative for ear pain.   Eyes: Negative for visual disturbance.  Respiratory: Negative for cough and shortness of breath.   Cardiovascular: Negative for chest pain and palpitations.  Gastrointestinal: Negative for abdominal pain and vomiting.  Skin: Negative for color change and rash.  Neurological: Positive for headaches. Negative for seizures and syncope.  All other systems reviewed and are negative.    Physical Exam Triage Vital Signs ED Triage Vitals  Enc Vitals Group     BP      Pulse      Resp      Temp      Temp src      SpO2      Weight      Height      Head Circumference  Peak Flow      Pain Score      Pain Loc      Pain Edu?      Excl. in GC?    No data found.  Updated Vital Signs BP 123/75   Pulse 66   Temp 98.7 F (37.1 C)   Resp 16   SpO2 96%   Visual Acuity Right Eye Distance:   Left Eye Distance:   Bilateral Distance:    Right Eye Near:   Left Eye Near:    Bilateral Near:     Physical Exam Vitals and nursing note reviewed.  Constitutional:      General: He is not in acute distress.    Appearance: He is well-developed. He is not ill-appearing.  HENT:     Head: Normocephalic and atraumatic.     Right Ear: Tympanic membrane normal.     Left Ear: Tympanic membrane normal.     Nose: Nose normal.     Mouth/Throat:     Mouth: Mucous membranes are  moist.     Pharynx: Posterior oropharyngeal erythema present.  Eyes:     Conjunctiva/sclera: Conjunctivae normal.  Cardiovascular:     Rate and Rhythm: Normal rate and regular rhythm.     Heart sounds: Normal heart sounds.  Pulmonary:     Effort: Pulmonary effort is normal. No respiratory distress.     Breath sounds: Normal breath sounds.  Abdominal:     Palpations: Abdomen is soft.     Tenderness: There is no abdominal tenderness.  Musculoskeletal:     Cervical back: Neck supple.  Skin:    General: Skin is warm and dry.     Findings: No rash.  Neurological:     General: No focal deficit present.     Mental Status: He is alert and oriented to person, place, and time.     Gait: Gait normal.  Psychiatric:        Mood and Affect: Mood normal.        Behavior: Behavior normal.      UC Treatments / Results  Labs (all labs ordered are listed, but only abnormal results are displayed) Labs Reviewed  CULTURE, GROUP A STREP ([facility])  NOVEL CORONAVIRUS, NAA  POCT RAPID STREP A (OFFICE)    EKG   Radiology No results found.  Procedures Procedures (including critical care time)  Medications Ordered in UC Medications - No data to display  Initial Impression / Assessment and Plan / UC Course  I have reviewed the triage vital signs and the nursing notes.  Pertinent labs & imaging results that were available during my care of the patient were reviewed by me and considered in my medical decision making (see chart for details).   Sore throat, COVID test.  Rapid strep negative; culture pending.  COVID pending.  Instructed patient to self quarantine until the test result is back.  Discussed symptomatic treatment including Tylenol or ibuprofen, rest, hydration.  Instructed patient to follow up with PCP if his symptoms are not improving.  Patient agrees to plan of care.    Final Clinical Impressions(s) / UC Diagnoses   Final diagnoses:  Encounter for laboratory testing for  COVID-19 virus  Sore throat     Discharge Instructions     Your rapid strep test is negative.  A throat culture is pending; we will call you if it is positive requiring treatment.    Your COVID test is pending.  You should self quarantine until the  test result is back.    Take Tylenol or ibuprofen as needed for fever or discomfort.  Rest and keep yourself hydrated.    Follow-up with your primary care provider if your symptoms are not improving.        ED Prescriptions    None     PDMP not reviewed this encounter.   Mickie Bail, NP 03/30/21 1054

## 2021-03-31 LAB — SARS-COV-2, NAA 2 DAY TAT

## 2021-03-31 LAB — NOVEL CORONAVIRUS, NAA: SARS-CoV-2, NAA: NOT DETECTED

## 2021-04-02 LAB — CULTURE, GROUP A STREP (THRC)

## 2021-04-21 ENCOUNTER — Encounter: Payer: Self-pay | Admitting: Emergency Medicine

## 2021-04-21 ENCOUNTER — Ambulatory Visit
Admission: EM | Admit: 2021-04-21 | Discharge: 2021-04-21 | Disposition: A | Discharge status: Home / Self Care | Payer: BC Managed Care – PPO | Attending: Emergency Medicine | Admitting: Emergency Medicine

## 2021-04-21 DIAGNOSIS — J302 Other seasonal allergic rhinitis: Secondary | ICD-10-CM

## 2021-04-21 DIAGNOSIS — J029 Acute pharyngitis, unspecified: Secondary | ICD-10-CM | POA: Diagnosis not present

## 2021-04-21 DIAGNOSIS — B349 Viral infection, unspecified: Secondary | ICD-10-CM | POA: Diagnosis not present

## 2021-04-21 LAB — POCT RAPID STREP A (OFFICE): Rapid Strep A Screen: NEGATIVE

## 2021-04-21 NOTE — ED Provider Notes (Signed)
Willie Farmer    CSN: [id number] Arrival date & time: 04/21/21  9924      History   Chief Complaint Chief Complaint  Patient presents with  . Sore Throat    HPI Willie CERAMI Sr. is a 45 y.o. male.   Patient presents with 2-day history of sore throat, congestion, postnasal drip, nausea.  He reports vomiting yesterday but none today.  He denies fever, chills, rash, cough, shortness of breath, diarrhea, or other symptoms.  Treatment at home with OTC cold and allergy meds.  His medical history includes mood disorder, seasonal allergies, GERD.  Patient was seen here on 03/03/2021; diagnosed with viral illness with cough; treated symptomatically; he declined COVID test at that time.  He was seen here again on 03/30/2021;; diagnosed with sore throat and encounter for COVID test; strep and COVID-negative; treated symptomatically.  The history is provided by the patient and medical records.    Past Medical History:  Diagnosis Date  . History of anal fissures 2015  . History of viral meningitis 1995  . Right ureteral stone   . Seasonal allergies   . Wears glasses     Patient Active Problem List   Diagnosis Date Noted  . Oral aphthous ulcer 02/26/2020  . GERD (gastroesophageal reflux disease) 08/20/2018  . Mood disorder (HCC) 08/20/2018  . Seasonal allergic rhinitis 03/02/2017    Past Surgical History:  Procedure Laterality Date  . ESOPHAGOGASTRODUODENOSCOPY  08/01/2012  . URETEROSCOPY WITH HOLMIUM LASER LITHOTRIPSY Right 10/07/2019   Procedure: URETEROSCOPY / STENT PLACEMENT STONE BASKET RETRIVAL;  Surgeon: Ihor Gully, MD;  Location: [facility];  Service: Urology;  Laterality: Right;  . WISDOM TOOTH EXTRACTION         Home Medications    Prior to Admission medications   Medication Sig Start Date End Date Taking? Authorizing Provider  levocetirizine (XYZAL) 5 MG tablet Take 5 mg by mouth every evening.   Yes [provider]   cetirizine-pseudoephedrine (ZYRTEC-D) 5-120 MG tablet Take 1 tablet by mouth daily.    [provider]  triamcinolone (KENALOG) 0.1 % paste Use as directed 1 application in the mouth or throat 2 (two) times daily. 02/26/20   Eustaquio Boyden, MD    Family History Family History  Problem Relation Age of Onset  . Prostate cancer Father   . Cancer Mother   . Prostate cancer Paternal Grandfather   . Cancer Paternal Grandfather   . Prostate cancer Paternal Uncle   . Colon cancer Neg Hx     Social History Social History   Tobacco Use  . Smoking status: Never Smoker  . Smokeless tobacco: Never Used  Vaping Use  . Vaping Use: Never used  Substance Use Topics  . Alcohol use: Yes    Comment: occasional  . Drug use: Never     Allergies   Latex   Review of Systems Review of Systems  Constitutional: Negative for chills and fever.  HENT: Positive for congestion, postnasal drip and sore throat. Negative for ear pain.   Respiratory: Negative for cough and shortness of breath.   Cardiovascular: Negative for chest pain and palpitations.  Gastrointestinal: Positive for nausea and vomiting. Negative for abdominal pain and diarrhea.  Skin: Negative for color change and rash.  All other systems reviewed and are negative.    Physical Exam Triage Vital Signs ED Triage Vitals  Enc Vitals Group     BP      Pulse  Resp      Temp      Temp src      SpO2      Weight      Height      Head Circumference      Peak Flow      Pain Score      Pain Loc      Pain Edu?      Excl. in GC?    No data found.  Updated Vital Signs BP 131/88 (BP Location: Left Arm)   Pulse 68   Temp 98.4 F (36.9 C) (Oral)   Resp 18   SpO2 97%   Visual Acuity Right Eye Distance:   Left Eye Distance:   Bilateral Distance:    Right Eye Near:   Left Eye Near:    Bilateral Near:     Physical Exam Vitals and nursing note reviewed.  Constitutional:      General: He is not in acute  distress.    Appearance: He is well-developed. He is not ill-appearing.  HENT:     Head: Normocephalic and atraumatic.     Right Ear: Tympanic membrane normal.     Left Ear: Tympanic membrane normal.     Nose: Nose normal.     Mouth/Throat:     Mouth: Mucous membranes are moist.     Pharynx: Oropharynx is clear.  Eyes:     Conjunctiva/sclera: Conjunctivae normal.  Cardiovascular:     Rate and Rhythm: Normal rate and regular rhythm.     Heart sounds: Normal heart sounds.  Pulmonary:     Effort: Pulmonary effort is normal. No respiratory distress.     Breath sounds: Normal breath sounds.  Abdominal:     General: Bowel sounds are normal. There is no distension.     Palpations: Abdomen is soft.     Tenderness: There is no abdominal tenderness. There is no guarding or rebound.  Musculoskeletal:     Cervical back: Neck supple.  Skin:    General: Skin is warm and dry.     Findings: No rash.  Neurological:     General: No focal deficit present.     Mental Status: He is alert and oriented to person, place, and time.     Gait: Gait normal.  Psychiatric:        Mood and Affect: Mood normal.        Behavior: Behavior normal.      UC Treatments / Results  Labs (all labs ordered are listed, but only abnormal results are displayed) Labs Reviewed  CULTURE, GROUP A STREP [facility])  POCT RAPID STREP A (OFFICE)    EKG   Radiology No results found.  Procedures Procedures (including critical care time)  Medications Ordered in UC Medications - No data to display  Initial Impression / Assessment and Plan / UC Course  I have reviewed the triage vital signs and the nursing notes.  Pertinent labs & imaging results that were available during my care of the patient were reviewed by me and considered in my medical decision making (see chart for details).   Sore throat, Viral illness.  Rapid strep negative; culture pending.  Patient declined treatment with Zofran.  Instructed him to  keep himself hydrated with clear liquids.  Education provided on sore throats and viral illnesses.  Instructed him to follow-up with his PCP if his symptoms are not improving.  He agrees to plan of care.   Final Clinical Impressions(s) / UC Diagnoses  Final diagnoses:  Sore throat  Viral illness     Discharge Instructions     Your rapid strep test is negative.  A throat culture is pending; we will call you if it is positive requiring treatment.    Keep yourself hydrated with clear liquids, such as water, Gatorade, Pedialyte, Sprite, or ginger ale.    Follow up with your primary care provider if your symptoms are not improving.          ED Prescriptions    None     PDMP not reviewed this encounter.   Mickie Bail, NP 04/21/21 [phone]

## 2021-04-21 NOTE — Discharge Instructions (Signed)
Your rapid strep test is negative.  A throat culture is pending; we will call you if it is positive requiring treatment.    Keep yourself hydrated with clear liquids, such as water, Gatorade, Pedialyte, Sprite, or ginger ale.    Follow up with your primary care provider if your symptoms are not improving.

## 2021-04-21 NOTE — ED Triage Notes (Signed)
Pt presented today with c/o of nasal congestion/drainage, sore throat and nausea x 2 days. Denies fever. No meds pta.

## 2021-04-24 LAB — CULTURE, GROUP A STREP (THRC)

## 2021-06-18 DIAGNOSIS — F4320 Adjustment disorder, unspecified: Secondary | ICD-10-CM | POA: Diagnosis not present

## 2021-07-01 DIAGNOSIS — F4320 Adjustment disorder, unspecified: Secondary | ICD-10-CM | POA: Diagnosis not present

## 2021-07-11 ENCOUNTER — Other Ambulatory Visit: Payer: Self-pay

## 2021-07-11 ENCOUNTER — Ambulatory Visit
Admission: EM | Admit: 2021-07-11 | Discharge: 2021-07-11 | Disposition: A | Discharge status: Home / Self Care | Payer: BC Managed Care – PPO | Attending: Emergency Medicine | Admitting: Emergency Medicine

## 2021-07-11 DIAGNOSIS — Z1152 Encounter for screening for COVID-19: Secondary | ICD-10-CM | POA: Diagnosis not present

## 2021-07-11 DIAGNOSIS — B349 Viral infection, unspecified: Secondary | ICD-10-CM

## 2021-07-11 NOTE — ED Provider Notes (Signed)
[name]    CSN: [id number] Arrival date & time: 07/11/21  1538      History   Chief Complaint Chief Complaint  Patient presents with   Sore Throat   Cough   Nasal Congestion    HPI Willie SAKATA Sr. is a 45 y.o. male.  Patient presents with 4-day history of congestion, runny nose, sore throat, cough.  He has felt warm at home but has not had a fever.  He requests testing for COVID and flu.  He denies shortness of breath, or other symptoms.  Treatment attempted at home with Xyzal and nasal spray.  His medical history includes seasonal allergies.  The history is provided by the patient and medical records.   Past Medical History:  Diagnosis Date   History of anal fissures 2015   History of viral meningitis 1995   Right ureteral stone    Seasonal allergies    Wears glasses     Patient Active Problem List   Diagnosis Date Noted   Oral aphthous ulcer 02/26/2020   GERD (gastroesophageal reflux disease) 08/20/2018   Mood disorder (HCC) 08/20/2018   Seasonal allergic rhinitis 03/02/2017    Past Surgical History:  Procedure Laterality Date   ESOPHAGOGASTRODUODENOSCOPY  08/01/2012   URETEROSCOPY WITH HOLMIUM LASER LITHOTRIPSY Right 10/07/2019   Procedure: URETEROSCOPY / STENT PLACEMENT STONE BASKET RETRIVAL;  Surgeon: Ihor Gully, MD;  Location: [facility];  Service: Urology;  Laterality: Right;   WISDOM TOOTH EXTRACTION         Home Medications    Prior to Admission medications   Medication Sig Start Date End Date Taking? Authorizing Provider  levocetirizine (XYZAL) 5 MG tablet Take 5 mg by mouth every evening.   Yes [provider]  cetirizine-pseudoephedrine (ZYRTEC-D) 5-120 MG tablet Take 1 tablet by mouth daily.    [provider]  triamcinolone (KENALOG) 0.1 % paste Use as directed 1 application in the mouth or throat 2 (two) times daily. 02/26/20   Eustaquio Boyden, MD    Family History Family History   Problem Relation Age of Onset   Prostate cancer Father    Cancer Mother    Prostate cancer Paternal Grandfather    Cancer Paternal Grandfather    Prostate cancer Paternal Uncle    Colon cancer Neg Hx     Social History Social History   Tobacco Use   Smoking status: Never   Smokeless tobacco: Never  Vaping Use   Vaping Use: Never used  Substance Use Topics   Alcohol use: Yes    Comment: occasional   Drug use: Never     Allergies   Latex   Review of Systems Review of Systems  Constitutional:  Negative for chills and fever.  HENT:  Positive for congestion, rhinorrhea and sore throat. Negative for ear pain.   Respiratory:  Positive for cough. Negative for shortness of breath.   Cardiovascular:  Negative for chest pain and palpitations.  Gastrointestinal:  Negative for abdominal pain, diarrhea and vomiting.  Skin:  Negative for color change and rash.  All other systems reviewed and are negative.   Physical Exam Triage Vital Signs ED Triage Vitals  Enc Vitals Group     BP      Pulse      Resp      Temp      Temp src      SpO2      Weight      Height  Head Circumference      Peak Flow      Pain Score      Pain Loc      Pain Edu?      Excl. in GC?    No data found.  Updated Vital Signs BP (!) 145/80   Pulse 80   Temp 98.2 F (36.8 C)   Resp 18   Ht 6\' 2"  (1.88 m)   Wt 170 lb (77.1 kg)   SpO2 96%   BMI 21.83 kg/m   Visual Acuity Right Eye Distance:   Left Eye Distance:   Bilateral Distance:    Right Eye Near:   Left Eye Near:    Bilateral Near:     Physical Exam Vitals and nursing note reviewed.  Constitutional:      General: He is not in acute distress.    Appearance: He is well-developed.  HENT:     Head: Normocephalic and atraumatic.     Right Ear: Tympanic membrane normal.     Left Ear: Tympanic membrane normal.     Nose: Rhinorrhea present.     Mouth/Throat:     Mouth: Mucous membranes are moist.     Pharynx: Oropharynx is  clear.  Eyes:     Conjunctiva/sclera: Conjunctivae normal.  Cardiovascular:     Rate and Rhythm: Normal rate and regular rhythm.     Heart sounds: No murmur heard. Pulmonary:     Effort: Pulmonary effort is normal. No respiratory distress.     Breath sounds: Normal breath sounds.  Abdominal:     Palpations: Abdomen is soft.     Tenderness: There is no abdominal tenderness.  Musculoskeletal:     Cervical back: Neck supple.  Skin:    General: Skin is warm and dry.  Neurological:     General: No focal deficit present.     Mental Status: He is alert and oriented to person, place, and time.     Gait: Gait normal.  Psychiatric:        Mood and Affect: Mood normal.        Behavior: Behavior normal.     UC Treatments / Results  Labs (all labs ordered are listed, but only abnormal results are displayed) Labs Reviewed  COVID-19, FLU A+B NAA    EKG   Radiology No results found.  Procedures Procedures (including critical care time)  Medications Ordered in UC Medications - No data to display  Initial Impression / Assessment and Plan / UC Course  I have reviewed the triage vital signs and the nursing notes.  Pertinent labs & imaging results that were available during my care of the patient were reviewed by me and considered in my medical decision making (see chart for details).   Viral illness.  COVID and Flu pending.  Instructed patient to self quarantine per CDC guidelines.  Discussed symptomatic treatment including Tylenol or ibuprofen, rest, hydration.  Instructed patient to follow up with PCP if symptoms are not improving.  Patient agrees to plan of care.    Final Clinical Impressions(s) / UC Diagnoses   Final diagnoses:  Encounter for screening for COVID-19  Viral illness     Discharge Instructions      Your COVID and Flu tests are pending.  You should self quarantine until the test results are back.    Take Tylenol or ibuprofen as needed for fever or  discomfort.  Rest and keep yourself hydrated.    Follow-up with your primary care provider  if your symptoms are not improving.         ED Prescriptions   None    PDMP not reviewed this encounter.   Mickie Bail, NP 07/11/21 1556

## 2021-07-11 NOTE — Discharge Instructions (Addendum)
Your COVID and Flu tests are pending.  You should self quarantine until the test results are back.    Take Tylenol or ibuprofen as needed for fever or discomfort.  Rest and keep yourself hydrated.    Follow-up with your primary care provider if your symptoms are not improving.     

## 2021-07-11 NOTE — ED Triage Notes (Signed)
Pt reports cough, sore throat, nasal congestions x4 days. Clear mucus. Denies fever, NAD noted.   -COVID home test, unknown of exposure.

## 2021-07-12 LAB — COVID-19, FLU A+B NAA
Influenza A, NAA: NOT DETECTED
Influenza B, NAA: NOT DETECTED
SARS-CoV-2, NAA: NOT DETECTED

## 2021-07-13 DIAGNOSIS — Z20822 Contact with and (suspected) exposure to covid-19: Secondary | ICD-10-CM | POA: Diagnosis not present

## 2021-07-20 ENCOUNTER — Ambulatory Visit
Admission: EM | Admit: 2021-07-20 | Discharge: 2021-07-20 | Disposition: A | Discharge status: Home / Self Care | Payer: BC Managed Care – PPO | Attending: Emergency Medicine | Admitting: Emergency Medicine

## 2021-07-20 ENCOUNTER — Telehealth: Payer: Self-pay | Admitting: Emergency Medicine

## 2021-07-20 ENCOUNTER — Encounter: Payer: Self-pay | Admitting: Emergency Medicine

## 2021-07-20 ENCOUNTER — Other Ambulatory Visit: Payer: Self-pay

## 2021-07-20 DIAGNOSIS — J0100 Acute maxillary sinusitis, unspecified: Secondary | ICD-10-CM | POA: Diagnosis not present

## 2021-07-20 MED ORDER — AMOXICILLIN-POT CLAVULANATE 875-125 MG PO TABS: 1.0000 | ORAL_TABLET | Freq: Two times a day (BID) | ORAL | 0 refills | Status: DC

## 2021-07-20 MED ORDER — AMOXICILLIN 875 MG PO TABS: 875.0000 mg | ORAL_TABLET | Freq: Two times a day (BID) | ORAL | 0 refills | Status: AC

## 2021-07-20 NOTE — ED Provider Notes (Signed)
Willie Farmer    CSN: [id number] Arrival date & time: 07/20/21  0849      History   Chief Complaint Chief Complaint  Patient presents with   URI     HPI Willie GELL Sr. is a 45 y.o. male.  Patient presents with 3-week history of sinus congestion, yellow-green sinus drainage, postnasal drip, runny nose, cough.  He denies fever, chills, shortness of breath, or other symptoms.  OTC treatment attempted at home.  Patient was seen here on 07/11/2021; diagnosed with viral illness; treated symptomatically; COVID and flu negative.  His medical history includes seasonal allergies, GERD, mood disorder.  The history is provided by the patient and medical records.   Past Medical History:  Diagnosis Date   History of anal fissures 2015   History of viral meningitis 1995   Right ureteral stone    Seasonal allergies    Wears glasses     Patient Active Problem List   Diagnosis Date Noted   Oral aphthous ulcer 02/26/2020   GERD (gastroesophageal reflux disease) 08/20/2018   Mood disorder (HCC) 08/20/2018   Seasonal allergic rhinitis 03/02/2017    Past Surgical History:  Procedure Laterality Date   ESOPHAGOGASTRODUODENOSCOPY  08/01/2012   URETEROSCOPY WITH HOLMIUM LASER LITHOTRIPSY Right 10/07/2019   Procedure: URETEROSCOPY / STENT PLACEMENT STONE BASKET RETRIVAL;  Surgeon: Ihor Gully, MD;  Location: [facility];  Service: Urology;  Laterality: Right;   WISDOM TOOTH EXTRACTION         Home Medications    Prior to Admission medications   Medication Sig Start Date End Date Taking? Authorizing Provider  amoxicillin-clavulanate (AUGMENTIN) 875-125 MG tablet Take 1 tablet by mouth every 12 (twelve) hours. 07/20/21  Yes Mickie Bail, NP  cetirizine-pseudoephedrine (ZYRTEC-D) 5-120 MG tablet Take 1 tablet by mouth daily.    [provider]  levocetirizine (XYZAL) 5 MG tablet Take 5 mg by mouth every evening.    [provider]  triamcinolone  (KENALOG) 0.1 % paste Use as directed 1 application in the mouth or throat 2 (two) times daily. 02/26/20   Eustaquio Boyden, MD    Family History Family History  Problem Relation Age of Onset   Prostate cancer Father    Cancer Mother    Prostate cancer Paternal Grandfather    Cancer Paternal Grandfather    Prostate cancer Paternal Uncle    Colon cancer Neg Hx     Social History Social History   Tobacco Use   Smoking status: Never   Smokeless tobacco: Never  Vaping Use   Vaping Use: Never used  Substance Use Topics   Alcohol use: Yes    Comment: occasional   Drug use: Never     Allergies   Latex   Review of Systems Review of Systems  Constitutional:  Negative for chills and fever.  HENT:  Positive for congestion, postnasal drip, rhinorrhea and sinus pressure. Negative for ear pain and sore throat.   Respiratory:  Positive for cough. Negative for shortness of breath.   Cardiovascular:  Negative for chest pain and palpitations.  Gastrointestinal:  Negative for abdominal pain and vomiting.  Skin:  Negative for color change and rash.  All other systems reviewed and are negative.   Physical Exam Triage Vital Signs ED Triage Vitals  Enc Vitals Group     BP      Pulse      Resp      Temp      Temp src  SpO2      Weight      Height      Head Circumference      Peak Flow      Pain Score      Pain Loc      Pain Edu?      Excl. in GC?    No data found.  Updated Vital Signs BP 131/88   Pulse 72   Temp 98.3 F (36.8 C) (Oral)   Resp 20   SpO2 98%   Visual Acuity Right Eye Distance:   Left Eye Distance:   Bilateral Distance:    Right Eye Near:   Left Eye Near:    Bilateral Near:     Physical Exam Vitals and nursing note reviewed.  Constitutional:      General: He is not in acute distress.    Appearance: He is well-developed.  HENT:     Head: Normocephalic and atraumatic.     Right Ear: Tympanic membrane normal.     Left Ear: Tympanic  membrane normal.     Nose: Congestion and rhinorrhea present.     Mouth/Throat:     Mouth: Mucous membranes are moist.     Pharynx: Oropharynx is clear.  Eyes:     Conjunctiva/sclera: Conjunctivae normal.  Cardiovascular:     Rate and Rhythm: Normal rate and regular rhythm.     Heart sounds: Normal heart sounds. No murmur heard. Pulmonary:     Effort: Pulmonary effort is normal. No respiratory distress.     Breath sounds: Normal breath sounds.  Abdominal:     Palpations: Abdomen is soft.     Tenderness: There is no abdominal tenderness.  Musculoskeletal:     Cervical back: Neck supple.  Skin:    General: Skin is warm and dry.  Neurological:     General: No focal deficit present.     Mental Status: He is alert and oriented to person, place, and time.  Psychiatric:        Mood and Affect: Mood normal.        Behavior: Behavior normal.     UC Treatments / Results  Labs (all labs ordered are listed, but only abnormal results are displayed) Labs Reviewed - No data to display  EKG   Radiology No results found.  Procedures Procedures (including critical care time)  Medications Ordered in UC Medications - No data to display  Initial Impression / Assessment and Plan / UC Course  I have reviewed the triage vital signs and the nursing notes.  Pertinent labs & imaging results that were available during my care of the patient were reviewed by me and considered in my medical decision making (see chart for details).  Acute sinusitis.  Treating with Augmentin.  Discussed symptomatic treatment including Tylenol or ibuprofen and plain Mucinex.  Instructed patient to follow-up with his PCP or an ENT if his symptoms persist.  He agrees to plan of care.   Final Clinical Impressions(s) / UC Diagnoses   Final diagnoses:  Acute non-recurrent maxillary sinusitis     Discharge Instructions      Take the Augmentin as directed.    Take Tylenol or ibuprofen as needed for  discomfort and plain Mucinex as needed for congestion.    Follow up with your primary care provider or an ENT if your symptoms are not improving.        ED Prescriptions     Medication Sig Dispense Auth. Provider   amoxicillin-clavulanate (AUGMENTIN)  875-125 MG tablet Take 1 tablet by mouth every 12 (twelve) hours. 14 tablet Mickie Bail, NP      PDMP not reviewed this encounter.   Mickie Bail, NP 07/20/21 [phone]

## 2021-07-20 NOTE — ED Triage Notes (Signed)
Pt here with persistent URI sx x 2-3 weeks.

## 2021-07-20 NOTE — Discharge Instructions (Addendum)
Take the Augmentin as directed.    Take Tylenol or ibuprofen as needed for discomfort and plain Mucinex as needed for congestion.    Follow up with your primary care provider or an ENT if your symptoms are not improving.

## 2021-07-23 DIAGNOSIS — F4320 Adjustment disorder, unspecified: Secondary | ICD-10-CM | POA: Diagnosis not present

## 2021-08-02 DIAGNOSIS — F4320 Adjustment disorder, unspecified: Secondary | ICD-10-CM | POA: Diagnosis not present

## 2021-08-16 DIAGNOSIS — F4320 Adjustment disorder, unspecified: Secondary | ICD-10-CM | POA: Diagnosis not present

## 2021-08-31 ENCOUNTER — Ambulatory Visit: Payer: BC Managed Care – PPO | Admitting: Allergy

## 2021-08-31 ENCOUNTER — Other Ambulatory Visit: Payer: Self-pay

## 2021-08-31 ENCOUNTER — Encounter: Payer: Self-pay | Admitting: Allergy

## 2021-08-31 VITALS — BP 118/70 | HR 78 | Temp 96.5°F | Resp 16 | Ht 73.03 in | Wt 179.5 lb

## 2021-08-31 DIAGNOSIS — T781XXD Other adverse food reactions, not elsewhere classified, subsequent encounter: Secondary | ICD-10-CM

## 2021-08-31 DIAGNOSIS — J310 Chronic rhinitis: Secondary | ICD-10-CM | POA: Insufficient documentation

## 2021-08-31 MED ORDER — AZELASTINE HCL 0.1 % NA SOLN: 1.0000 | Freq: Two times a day (BID) | NASAL | 5 refills | Status: DC | PRN

## 2021-08-31 NOTE — Progress Notes (Signed)
New Willie Farmer Note  RE: Willie ESGUERRA Sr. MRN: [id number] DOB: 1976-08-06 Date of Office Visit: 08/31/2021  Consult requested by: No ref. provider found Primary care provider: Lorre Munroe, NP  Chief Complaint: Allergy Testing (Shellfish, and environmentals/Very snotty, throat feels like it maybe closing )  History of Present Illness: I had the pleasure of seeing Willie Farmer for initial evaluation at the [facility] on 08/31/2021. Willie Farmer is a 45 y.o. male, who is self-referred here for the evaluation of allergic rhinitis and food allergy.  Willie Farmer reports symptoms of nasal congestion, PND, rhinorrhea, sneezing, itchy/watery eyes. Symptoms have been going on for many years but worst this past year. The symptoms are present mainly from spring through fall. Other triggers include exposure to unknown. Anosmia: no. Headache: no. Willie Farmer has used Xyzal, nettipot, Flonase with minimal improvement in symptoms. Sinus infections: one. Previous work up includes: none. Previous ENT evaluation: no prior sinus surgery. Previous sinus imaging: no. History of nasal polyps: no. Last eye exam: 1 month ago. History of reflux: not recently.  Willie Farmer reports possible food allergy to shellfish and white sauce.  Willie Farmer noticed that Willie Farmer does not feel good after eating at [employer] or [employer]. Denies any nausea, vomiting, rash, itching. Symptoms resolve without any medications after 1 hour.  Shellfish sauce sometimes causes increased mucous.   Past work up includes: none. Dietary History: Willie Farmer has been eating other foods including milk, eggs, peanut, treenuts, sesame, limited soy, wheat, meats, fruits and vegetables.   Willie Farmer reports reading labels and avoiding seafood in diet completely.   Assessment and Plan: Willie Farmer is a 45 y.o. male with: Chronic rhinitis Rhinoconjunctivitis symptoms mainly from the spring through fall.  Tried Xyzal, Nettie pot and Flonase with minimal benefit.  No prior  allergy/ENT evaluation. Today's skin prick testing showed: Negative to indoor/outdoor allergens and select foods but positive control was negative as well questioning the validity of the results.  Confirmed with Willie Farmer that Willie Farmer has not taken any antihistamines for a few weeks now.  Use Nasacort (triamcinolone) nasal spray 1 spray per nostril twice a day as needed for nasal congestion. Sample given.  Use azelastine nasal spray 1-2 sprays per nostril twice a day as needed for runny nose/drainage. Get bloodwork as below. If no allergies found, will refer to ENT next.   Other adverse food reactions, not elsewhere classified, subsequent encounter Concerned about possible seafood and soy allergy. Willie Farmer does not feel well after eating at [employer] or [employer]. Shellfish causes increased mucous production. Symptoms resolve within 1 hour without any medications. Today's skin testing was negative to select foods including seafood and soy but positive control was not reactive questioning the validity of the results.  Continue to avoid seafood and "white sauce". Get bloodwork.  For mild symptoms you can take over the counter antihistamines such as Benadryl and monitor symptoms closely. If symptoms worsen or if you have severe symptoms including breathing issues, throat closure, significant swelling, whole body hives, severe diarrhea and vomiting, lightheadedness then seek immediate medical care.  Return in about 2 months (around 10/31/2021).  Meds ordered this encounter  Medications   azelastine (ASTELIN) 0.1 % nasal spray    Sig: Place 1-2 sprays into both nostrils 2 (two) times daily as needed (nasal drainage). Use in each nostril as directed    Dispense:  30 mL    Refill:  5    Lab Orders         Allergen  Profile, Food-Fish         Allergen Profile, Shellfish         Allergens w/Total IgE Area 2         Soybean IgE      Other allergy screening: Asthma: no Medication allergy:  Latex -  rash. ? Pfizer vaccine.  Hymenoptera allergy: no Urticaria: no Eczema:no History of recurrent infections suggestive of immunodeficency: no  Diagnostics: Skin Testing: Environmental allergy panel and select foods. Negative to indoor/outdoor allergens and select foods but positive control was negative as well questioning the validity of the results.  Results discussed with Willie Farmer/family.  Airborne Adult Perc - 08/31/21 1023     Time Antigen Placed 1023    Allergen Manufacturer Waynette Buttery    Location Back    Number of Test 59    1. Control-Buffer 50% Glycerol Negative    2. Control-Histamine 1 mg/ml Negative    3. Albumin saline Negative    4. Bahia Negative    5. French Southern Territories Negative    6. Johnson Negative    7. Kentucky Blue Negative    8. Meadow Fescue Negative    9. Perennial Rye Negative    10. Sweet Vernal Negative    11. Timothy Negative    12. Cocklebur Negative    13. Burweed Marshelder Negative    14. Ragweed, short Negative    15. Ragweed, Giant Negative    16. Plantain,  English Negative    17. Lamb's Quarters Negative    18. Sheep Sorrell Negative    19. Rough Pigweed Negative    20. Marsh Elder, Rough Negative    21. Mugwort, Common Negative    22. Ash mix Negative    23. Birch mix Negative    24. Beech American Negative    25. Box, Elder Negative    26. Cedar, red Negative    27. Cottonwood, Guinea-Bissau Negative    28. Elm mix Negative    29. Hickory Negative    30. Maple mix Negative    31. Oak, Guinea-Bissau mix Negative    32. Pecan Pollen Negative    33. Pine mix Negative    34. Sycamore Eastern Negative    35. Walnut, Black Pollen Negative    36. Alternaria alternata Negative    37. Cladosporium Herbarum Negative    38. Aspergillus mix Negative    39. Penicillium mix Negative    40. Bipolaris sorokiniana (Helminthosporium) Negative    41. Drechslera spicifera (Curvularia) Negative    42. Mucor plumbeus Negative    43. Fusarium moniliforme Negative    44.  Aureobasidium pullulans (pullulara) Negative    45. Rhizopus oryzae Negative    46. Botrytis cinera Negative    47. Epicoccum nigrum Negative    48. Phoma betae Negative    49. Candida Albicans Negative    50. Trichophyton mentagrophytes Negative    51. Mite, D Farinae  5,000 AU/ml Negative    52. Mite, D Pteronyssinus  5,000 AU/ml Negative    53. Cat Hair 10,000 BAU/ml Negative    54.  Dog Epithelia Negative    55. Mixed Feathers Negative    56. Horse Epithelia Negative    57. Cockroach, German Negative    58. Mouse Negative    59. Tobacco Leaf Negative             Food Adult Perc - 08/31/21 1000     Time Antigen Placed 1023    Allergen Manufacturer Waynette Buttery  Location Back    Number of allergen test 23     Control-buffer 50% Glycerol Negative    Control-Histamine 1 mg/ml Negative    1. Peanut Negative    2. Soybean Negative    3. Wheat Negative    4. Sesame Negative    5. Milk, cow Negative    6. Egg White, Chicken Negative    7. Casein Negative    8. Shellfish Mix Negative    9. Fish Mix Negative    18. Catfish Negative    19. Bass Negative    20. Trout Negative    21. Tuna Negative    22. Salmon Negative    23. Flounder Negative    24. Codfish Negative    25. Shrimp Negative    26. Crab Negative    27. Lobster Negative    28. Oyster Negative    29. Scallops Negative             Past Medical History: Willie Farmer Active Problem List   Diagnosis Date Noted   Chronic rhinitis 08/31/2021   Other adverse food reactions, not elsewhere classified, subsequent encounter 08/31/2021   Oral aphthous ulcer 02/26/2020   GERD (gastroesophageal reflux disease) 08/20/2018   Mood disorder (HCC) 08/20/2018   Seasonal allergic rhinitis 03/02/2017   Past Medical History:  Diagnosis Date   Eczema    History of anal fissures 2015   History of viral meningitis 1995   Right ureteral stone    Seasonal allergies    Wears glasses    Past Surgical History: Past Surgical  History:  Procedure Laterality Date   ESOPHAGOGASTRODUODENOSCOPY  08/01/2012   URETEROSCOPY WITH HOLMIUM LASER LITHOTRIPSY Right 10/07/2019   Procedure: URETEROSCOPY / STENT PLACEMENT STONE BASKET RETRIVAL;  Surgeon: Ihor Gully, MD;  Location: [facility];  Service: Urology;  Laterality: Right;   WISDOM TOOTH EXTRACTION     Medication List:  Current Outpatient Medications  Medication Sig Dispense Refill   azelastine (ASTELIN) 0.1 % nasal spray Place 1-2 sprays into both nostrils 2 (two) times daily as needed (nasal drainage). Use in each nostril as directed 30 mL 5   levocetirizine (XYZAL) 5 MG tablet Take 5 mg by mouth every evening.     No current facility-administered medications for this visit.   Allergies: Allergies  Allergen Reactions   Latex Rash   Social History: Social History   Socioeconomic History   Marital status: Married    Spouse name: Not on file   Number of children: 1   Years of education: Not on file   Highest education level: Not on file  Occupational History   Occupation: Museum/gallery exhibitions officer    Employer: [employer]  Tobacco Use   Smoking status: Never   Smokeless tobacco: Never  Vaping Use   Vaping Use: Never used  Substance and Sexual Activity   Alcohol use: Yes    Comment: occasional   Drug use: Never   Sexual activity: Yes  Other Topics Concern   Not on file  Social History Narrative   Not on file   Social Determinants of Health   Financial Resource Strain: Not on file  Food Insecurity: Not on file  Transportation Needs: Not on file  Physical Activity: Not on file  Stress: Not on file  Social Connections: Not on file   Lives in a 53-75 year old house. Smoking: denies Occupation: Programme researcher, broadcasting/film/video History: Water Damage/mildew in the house: no Carpet in the family  room: no Carpet in the bedroom: yes Heating: gas Cooling: central Pet: no  Family History: Family History  Problem Relation  Age of Onset   Cancer Mother    Prostate cancer Father    Prostate cancer Paternal Uncle    Prostate cancer Paternal Grandfather    Cancer Paternal Grandfather    Colon cancer Neg Hx    Allergic rhinitis Neg Hx    Asthma Neg Hx    Eczema Neg Hx    Urticaria Neg Hx    Review of Systems  Constitutional:  Negative for appetite change, chills, fever and unexpected weight change.  HENT:  Positive for congestion, postnasal drip and rhinorrhea.   Eyes:  Negative for itching.  Respiratory:  Negative for cough, chest tightness, shortness of breath and wheezing.   Cardiovascular:  Negative for chest pain.  Gastrointestinal:  Negative for abdominal pain.  Genitourinary:  Negative for difficulty urinating.  Skin:  Negative for rash.  Neurological:  Negative for headaches.   Objective: BP 118/70 (BP Location: Right Arm, Willie Farmer Position: Sitting, Cuff Size: Normal)   Pulse 78   Temp (!) 96.5 F (35.8 C) (Temporal)   Resp 16   Ht 6' 1.03" (1.855 m)   Wt 179 lb 8 oz (81.4 kg)   SpO2 100%   BMI 23.66 kg/m  Body mass index is 23.66 kg/m. Physical Exam Vitals and nursing note reviewed.  Constitutional:      Appearance: Normal appearance. Willie Farmer is well-developed.  HENT:     Head: Normocephalic and atraumatic.     Right Ear: Tympanic membrane and external ear normal.     Left Ear: Tympanic membrane and external ear normal.     Nose: Nose normal.     Mouth/Throat:     Mouth: Mucous membranes are moist.     Pharynx: Oropharynx is clear.  Eyes:     Conjunctiva/sclera: Conjunctivae normal.  Cardiovascular:     Rate and Rhythm: Normal rate and regular rhythm.     Heart sounds: Normal heart sounds. No murmur heard.   No friction rub. No gallop.  Pulmonary:     Effort: Pulmonary effort is normal.     Breath sounds: Normal breath sounds. No wheezing, rhonchi or rales.  Musculoskeletal:     Cervical back: Neck supple.  Skin:    General: Skin is warm.     Findings: No rash.   Neurological:     Mental Status: Willie Farmer is alert and oriented to person, place, and time.  Psychiatric:        Behavior: Behavior normal.  The plan was reviewed with the Willie Farmer/family, and all questions/concerned were addressed.  It was my pleasure to see Willie Farmer today and participate in his care. Please feel free to contact me with any questions or concerns.  Sincerely,  Wyline Mood, DO Allergy & Immunology  [facility] office: [phone] [facility] office: [phone]

## 2021-08-31 NOTE — Assessment & Plan Note (Signed)
Rhinoconjunctivitis symptoms mainly from the spring through fall.  Tried Xyzal, Nettie pot and Flonase with minimal benefit.  No prior allergy/ENT evaluation.  Today's skin prick testing showed: Negative to indoor/outdoor allergens and select foods but positive control was negative as well questioning the validity of the results.   Confirmed with patient that he has not taken any antihistamines for a few weeks now.   Use Nasacort (triamcinolone) nasal spray 1 spray per nostril twice a day as needed for nasal congestion. Sample given.   Use azelastine nasal spray 1-2 sprays per nostril twice a day as needed for runny nose/drainage. . Get bloodwork as below. o If no allergies found, will refer to ENT next.

## 2021-08-31 NOTE — Assessment & Plan Note (Addendum)
Concerned about possible seafood and soy allergy. He does not feel well after eating at [employer] or [employer]. Shellfish causes increased mucous production. Symptoms resolve within 1 hour without any medications.  Today's skin testing was negative to select foods including seafood and soy but positive control was not reactive questioning the validity of the results.  . Continue to avoid seafood and "white sauce". . Get bloodwork.  . For mild symptoms you can take over the counter antihistamines such as Benadryl and monitor symptoms closely. If symptoms worsen or if you have severe symptoms including breathing issues, throat closure, significant swelling, whole body hives, severe diarrhea and vomiting, lightheadedness then seek immediate medical care.

## 2021-08-31 NOTE — Patient Instructions (Addendum)
Today's skin testing showed: Negative to indoor/outdoor allergens and select foods but positive control was negative as well questioning the validity of the results.   Rhinitis:  Use Nasacort (triamcinolone) nasal spray 1 spray per nostril twice a day as needed for nasal congestion. Sample given.  This you can buy over the counter.  Use azelastine nasal spray 1-2 sprays per nostril twice a day as needed for runny nose/drainage. Get bloodwork If no allergies found, will refer to ENT next.  We are ordering labs, so please allow 1-2 weeks for the results to come back. With the newly implemented Cures Act, the labs might be visible to you at the same time that they become visible to me. However, I will not address the results until all of the results are back, so please be patient.  In the meantime, continue recommendations in your patient instructions, including avoidance measures (if applicable), until you hear from me.  Food: Continue to avoid seafood and white sauce. Get bloodwork.  For mild symptoms you can take over the counter antihistamines such as Benadryl and monitor symptoms closely. If symptoms worsen or if you have severe symptoms including breathing issues, throat closure, significant swelling, whole body hives, severe diarrhea and vomiting, lightheadedness then seek immediate medical care.  Follow up in 2 months or sooner if needed in our [place] office.

## 2021-09-08 LAB — ALLERGEN PROFILE, FOOD-FISH
Allergen Mackerel IgE: <0.1 kU/L
Allergen Salmon IgE: <0.1 kU/L
Allergen Trout IgE: <0.1 kU/L
Allergen Walley Pike IgE: <0.1 kU/L
Codfish IgE: <0.1 kU/L
Halibut IgE: <0.1 kU/L
Tuna: <0.1 kU/L

## 2021-09-08 LAB — ALLERGENS W/TOTAL IGE AREA 2

## 2021-09-08 LAB — ALLERGEN PROFILE, SHELLFISH
Clam IgE: <0.1 kU/L
F023-IgE Crab: <0.1 kU/L
F080-IgE Lobster: <0.1 kU/L
F290-IgE Oyster: <0.1 kU/L
Scallop IgE: <0.1 kU/L
Shrimp IgE: <0.1 kU/L

## 2021-09-08 LAB — ALLERGEN SOYBEAN: Soybean IgE: <0.1 kU/L

## 2021-09-09 ENCOUNTER — Telehealth: Payer: Self-pay

## 2021-09-09 NOTE — Telephone Encounter (Signed)
-----   Message from Ellamae Sia, DO sent at 09/09/2021  8:13 AM EDT ----- Please place referral to ENT Dr. Suszanne Conners or [facility]. Dx: chronic non-allergic rhinitis.

## 2021-10-01 DIAGNOSIS — F4320 Adjustment disorder, unspecified: Secondary | ICD-10-CM | POA: Diagnosis not present

## 2021-10-06 ENCOUNTER — Ambulatory Visit: Payer: BLUE CROSS/BLUE SHIELD | Admitting: Internal Medicine

## 2021-11-17 DIAGNOSIS — F4320 Adjustment disorder, unspecified: Secondary | ICD-10-CM | POA: Diagnosis not present

## 2021-11-29 DIAGNOSIS — J342 Deviated nasal septum: Secondary | ICD-10-CM | POA: Diagnosis not present

## 2021-11-29 DIAGNOSIS — J343 Hypertrophy of nasal turbinates: Secondary | ICD-10-CM | POA: Diagnosis not present

## 2021-11-29 DIAGNOSIS — J310 Chronic rhinitis: Secondary | ICD-10-CM | POA: Diagnosis not present

## 2021-11-29 DIAGNOSIS — R0982 Postnasal drip: Secondary | ICD-10-CM | POA: Diagnosis not present

## 2021-12-01 ENCOUNTER — Encounter: Payer: Self-pay | Admitting: Allergy

## 2021-12-01 NOTE — Progress Notes (Signed)
Reviewed notes from Dr. Suszanne Conners. Date of service: 11/29/2021. See scanned notes for full documentation. Rhinoscopy showed moderate nasal mucosal congestion, nasal septal deviation and bilateral inferior turbinate hypertrophy. No polyps, mass, lesion or acute infection. Started on Atrovent nasal spray.

## 2021-12-09 DIAGNOSIS — F4320 Adjustment disorder, unspecified: Secondary | ICD-10-CM | POA: Diagnosis not present

## 2021-12-14 ENCOUNTER — Ambulatory Visit: Payer: Self-pay

## 2021-12-14 ENCOUNTER — Other Ambulatory Visit: Payer: Self-pay

## 2021-12-14 ENCOUNTER — Encounter: Payer: Self-pay | Admitting: Emergency Medicine

## 2021-12-14 ENCOUNTER — Ambulatory Visit
Admission: EM | Admit: 2021-12-14 | Discharge: 2021-12-14 | Disposition: A | Discharge status: Home / Self Care | Payer: BC Managed Care – PPO

## 2021-12-14 DIAGNOSIS — S0001XA Abrasion of scalp, initial encounter: Secondary | ICD-10-CM | POA: Diagnosis not present

## 2021-12-14 MED ORDER — HYDROCORTISONE 2.5 % EX LOTN: TOPICAL_LOTION | Freq: Every day | CUTANEOUS | 0 refills | Status: AC

## 2021-12-14 NOTE — ED Provider Notes (Signed)
Willie Farmer    CSN: [id number] Arrival date & time: 12/14/21  1800      History   Chief Complaint Chief Complaint  Patient presents with   Insect Bite    HPI Willie ATALLAH Sr. is a 46 y.o. male presenting with scalp lesions x6 months. Medical history mood disorder. Describes 6 months of itchy scalp. Has been scratching and picking at the scalp. Now states there are black dots. Concerned that worms are living in his skin. Also concerned for bedbugs. States he's checked everywhere and even thrown away his bedding without relief. Wife sleeps next to him and has not had an issue at all.  HPI  Past Medical History:  Diagnosis Date   Eczema    History of anal fissures 2015   History of viral meningitis 1995   Right ureteral stone    Seasonal allergies    Wears glasses     Patient Active Problem List   Diagnosis Date Noted   Chronic rhinitis 08/31/2021   Other adverse food reactions, not elsewhere classified, subsequent encounter 08/31/2021   Oral aphthous ulcer 02/26/2020   GERD (gastroesophageal reflux disease) 08/20/2018   Mood disorder ([place]) 08/20/2018   Seasonal allergic rhinitis 03/02/2017    Past Surgical History:  Procedure Laterality Date   ESOPHAGOGASTRODUODENOSCOPY  08/01/2012   URETEROSCOPY WITH HOLMIUM LASER LITHOTRIPSY Right 10/07/2019   Procedure: URETEROSCOPY / STENT PLACEMENT STONE BASKET RETRIVAL;  Surgeon: Kathie Rhodes, MD;  Location: [facility];  Service: Urology;  Laterality: Right;   WISDOM TOOTH EXTRACTION         Home Medications    Prior to Admission medications   Medication Sig Start Date End Date Taking? Authorizing Provider  hydrocortisone 2.5 % lotion Apply topically daily for 7 days. 12/14/21 12/21/21 Yes Hazel Sams, PA-C  ipratropium (ATROVENT) 0.06 % nasal spray SMARTSIG:2 Spray(s) Both Nares Twice Daily PRN 11/29/21  Yes [provider]  azelastine (ASTELIN) 0.1 % nasal spray Place 1-2 sprays into  both nostrils 2 (two) times daily as needed (nasal drainage). Use in each nostril as directed 08/31/21   Garnet Sierras, DO  levocetirizine (XYZAL) 5 MG tablet Take 5 mg by mouth every evening.    [provider]    Family History Family History  Problem Relation Age of Onset   Cancer Mother    Prostate cancer Father    Prostate cancer Paternal Uncle    Prostate cancer Paternal Grandfather    Cancer Paternal Grandfather    Colon cancer Neg Hx    Allergic rhinitis Neg Hx    Asthma Neg Hx    Eczema Neg Hx    Urticaria Neg Hx     Social History Social History   Tobacco Use   Smoking status: Never   Smokeless tobacco: Never  Vaping Use   Vaping Use: Never used  Substance Use Topics   Alcohol use: Yes    Comment: occasional   Drug use: Never     Allergies   Latex   Review of Systems Review of Systems  Skin:  Positive for color change.  All other systems reviewed and are negative.   Physical Exam Triage Vital Signs ED Triage Vitals  Enc Vitals Group     BP      Pulse      Resp      Temp      Temp src      SpO2      Weight  Height      Head Circumference      Peak Flow      Pain Score      Pain Loc      Pain Edu?      Excl. in [place]?    No data found.  Updated Vital Signs BP 135/82 (BP Location: Left Arm)    Pulse 70    Temp 98 F (36.7 C) (Oral)    Resp 16    SpO2 99%   Visual Acuity Right Eye Distance:   Left Eye Distance:   Bilateral Distance:    Right Eye Near:   Left Eye Near:    Bilateral Near:     Physical Exam Vitals reviewed.  Constitutional:      General: He is not in acute distress.    Appearance: Normal appearance. He is not ill-appearing.  HENT:     Head: Normocephalic and atraumatic.  Pulmonary:     Effort: Pulmonary effort is normal.  Skin:    Comments: See image below Posterior neck with few hyperpigmented papules and excoriations. NO warmth, erythema, induration, scaling. No lesions on hands, feet, arms, legs.   Neurological:     General: No focal deficit present.     Mental Status: He is alert and oriented to person, place, and time.  Psychiatric:        Mood and Affect: Mood normal.        Behavior: Behavior normal.        Thought Content: Thought content normal.        Judgment: Judgment normal.      UC Treatments / Results  Labs (all labs ordered are listed, but only abnormal results are displayed) Labs Reviewed - No data to display  EKG   Radiology No results found.  Procedures Procedures (including critical care time)  Medications Ordered in UC Medications - No data to display  Initial Impression / Assessment and Plan / UC Course  I have reviewed the triage vital signs and the nursing notes.  Pertinent labs & imaging results that were available during my care of the patient were reviewed by me and considered in my medical decision making (see chart for details).     This patient is a very pleasant 46 y.o. year old male presenting with excoriations. Patient concerned for insect bites. 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 STOP picking at skin. Low-potency hydrocortisone sent x1 week. ED return precautions discussed. Patient verbalizes understanding and agreement.     Final Clinical Impressions(s) / UC Diagnoses   Final diagnoses:  Excoriation of scalp, initial encounter     Discharge Instructions      -Hydrocortisone lotion once daily for about 7 days. Avoid use for longer than 7 consecutive days as this can cause skin lightening.      ED Prescriptions     Medication Sig Dispense Auth. Provider   hydrocortisone 2.5 % lotion Apply topically daily for 7 days. 59 mL Hazel Sams, PA-C      PDMP not reviewed this encounter.   Hazel Sams, PA-C 12/14/21 1829

## 2021-12-14 NOTE — Telephone Encounter (Signed)
° °  Chief Complaint: "Bug bites to back of my neck." Symptoms: Dime sized areas Frequency: Started in July Pertinent Negatives: Patient denies pain or itching Disposition: [] ED /[] Urgent Care (no appt availability in office) / [] Appointment(In office/virtual)/ []  [facility]/ [] Home Care/ [] Refused Recommended Disposition /[] [facility]/ []  Follow-up with PCP Additional Notes: Pt. Has not seen provider at this location. Asking to be seen for the acute issue and establish care. Unable to get through [facility] line. Please advise pt.   Answer Assessment - Initial Assessment Questions 1. TYPE of INSECT: "What type of insect was it?"      Unsure 2. ONSET: "When did you get bitten?"      Started July 3. LOCATION: "Where is the insect bite located?"      Back of neck 4. REDNESS: "Is the area red or pink?" If Yes, ask: "What size is area of redness?" (inches or cm). "When did the redness start?"     Yes 5. PAIN: "Is there any pain?" If Yes, ask: "How bad is it?"  (Scale 1-10; or mild, moderate, severe)     No 6. ITCHING: "Does it itch?" If Yes, ask: "How bad is the itch?"    - MILD: doesn't interfere with normal activities   - MODERATE-SEVERE: interferes with work, school, sleep, or other activities      No 7. SWELLING: "How big is the swelling?" (inches, cm, or compare to coins)     No 8. OTHER SYMPTOMS: "Do you have any other symptoms?"  (e.g., difficulty breathing, hives)     Feels nauseated, fatigue 9. PREGNANCY: "Is there any chance you are pregnant?" "When was your last menstrual period?"     N/a  Protocols used: Insect Bite-A-AH

## 2021-12-14 NOTE — Discharge Instructions (Addendum)
-  Hydrocortisone lotion once daily for about 7 days. Avoid use for longer than 7 consecutive days as this can cause skin lightening.

## 2021-12-14 NOTE — ED Triage Notes (Signed)
Pt presents with ?bug bites to the back of his head x 6 months.

## 2021-12-15 NOTE — Telephone Encounter (Signed)
I can see him tomorrow at 58 for the acute issue.  Please call and see if he would like to schedule.

## 2021-12-15 NOTE — Telephone Encounter (Signed)
noted 

## 2021-12-16 ENCOUNTER — Ambulatory Visit: Payer: Self-pay | Admitting: Internal Medicine

## 2021-12-24 DIAGNOSIS — F4320 Adjustment disorder, unspecified: Secondary | ICD-10-CM | POA: Diagnosis not present

## 2022-01-10 DIAGNOSIS — R0982 Postnasal drip: Secondary | ICD-10-CM | POA: Diagnosis not present

## 2022-01-10 DIAGNOSIS — J343 Hypertrophy of nasal turbinates: Secondary | ICD-10-CM | POA: Diagnosis not present

## 2022-01-10 DIAGNOSIS — J310 Chronic rhinitis: Secondary | ICD-10-CM | POA: Diagnosis not present

## 2022-01-10 DIAGNOSIS — J342 Deviated nasal septum: Secondary | ICD-10-CM | POA: Diagnosis not present

## 2022-02-07 DIAGNOSIS — F4320 Adjustment disorder, unspecified: Secondary | ICD-10-CM | POA: Diagnosis not present

## 2022-02-21 ENCOUNTER — Encounter: Payer: Self-pay | Admitting: Urgent Care

## 2022-02-21 ENCOUNTER — Ambulatory Visit
Admission: EM | Admit: 2022-02-21 | Discharge: 2022-02-21 | Disposition: A | Discharge status: Home / Self Care | Payer: BC Managed Care – PPO | Attending: Urgent Care | Admitting: Urgent Care

## 2022-02-21 DIAGNOSIS — M79621 Pain in right upper arm: Secondary | ICD-10-CM

## 2022-02-21 MED ORDER — NAPROXEN 500 MG PO TABS: 500.0000 mg | ORAL_TABLET | Freq: Two times a day (BID) | ORAL | 0 refills | Status: AC

## 2022-02-21 NOTE — ED Triage Notes (Signed)
Pt reports RT axilla pain for 5-6 days. Pt reports he does not have any swollen or red areas in Rt axilla area. Pt denies any new activities ,such as weight training or new sports. ?

## 2022-02-21 NOTE — ED Provider Notes (Signed)
?[facility] ? ? ? ?CSN: [id number] ?Arrival date & time: 02/21/22  1113 ? ? ?  ? ?History   ?Chief Complaint ?Chief Complaint  ?Patient presents with  ? pain axilla  ? ? ?HPI ?Willie Farmer Sr. is a 46 y.o. male.  ? ?Pleasant 46yo male patient with a known medical history of vasomotor rhinitis presents today with a 1 week onset of right axillary pain. He states the pain started out as a vague dull 1 out of 10 pain 1 week ago.  He states the pain has increased to a 5 out of 10, and become more consistent in nature.  He reports the pain is located centrally to his right axilla only, and denies any radiation to his chest, back, neck, or arm.  He reports full range of motion of his shoulder and denies any radicular symptoms.  He denies any swelling to the area.  He denies any weight loss, fevers, or drenching night sweats.  He has not tried any over-the-counter medications.  He admits to a history of working out, but denies any known injury to the area. ? ? ? ?Past Medical History:  ?Diagnosis Date  ? Eczema   ? History of anal fissures 2015  ? History of viral meningitis 1995  ? Right ureteral stone   ? Seasonal allergies   ? Wears glasses   ? ? ?Patient Active Problem List  ? Diagnosis Date Noted  ? Chronic rhinitis 08/31/2021  ? Other adverse food reactions, not elsewhere classified, subsequent encounter 08/31/2021  ? 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 GERD (gastroesophageal reflux disease) 08/20/2018  ? Mood disorder (HCC) 08/20/2018  ? Seasonal allergic rhinitis 03/02/2017  ? ? ?Past Surgical History:  ?Procedure Laterality Date  ? ESOPHAGOGASTRODUODENOSCOPY  08/01/2012  ? URETEROSCOPY WITH HOLMIUM LASER LITHOTRIPSY Right 10/07/2019  ? Procedure: URETEROSCOPY / STENT PLACEMENT STONE BASKET RETRIVAL;  Surgeon: Ihor Gully, MD;  Location: [facility];  Service: Urology;  Laterality: Right;  ? WISDOM TOOTH EXTRACTION    ? ? ? ? ? ?Home Medications   ? ?Prior to Admission medications    ?Medication Sig Start Date End Date Taking? Authorizing Provider  ?naproxen (NAPROSYN) 500 MG tablet Take 1 tablet (500 mg total) by mouth 2 (two) times daily with a meal for 5 days. 02/21/22 02/26/22 Yes Regine Christian L, PA  ?azelastine (ASTELIN) 0.1 % nasal spray Place 1-2 sprays into both nostrils 2 (two) times daily as needed (nasal drainage). Use in each nostril as directed 08/31/21   Ellamae Sia, DO  ?ipratropium (ATROVENT) 0.06 % nasal spray SMARTSIG:2 Spray(s) Both Nares Twice Daily PRN 11/29/21   [provider]  ?levocetirizine (XYZAL) 5 MG tablet Take 5 mg by mouth every evening.    [provider]  ? ? ?Family History ?Family History  ?Problem Relation Age of Onset  ? Cancer Mother   ? Prostate cancer Father   ? Prostate cancer Paternal Uncle   ? Prostate cancer Paternal Grandfather   ? Cancer Paternal Grandfather   ? Colon cancer Neg Hx   ? Allergic rhinitis Neg Hx   ? Asthma Neg Hx   ? Eczema Neg Hx   ? Urticaria Neg Hx   ? ? ?Social History ?Social History  ? ?Tobacco Use  ? Smoking status: Never  ? Smokeless tobacco: Never  ?Vaping Use  ? Vaping Use: Never used  ?Substance Use Topics  ? Alcohol use: Yes  ?  Comment: occasional  ?  Drug use: Never  ? ? ? ?Allergies   ?Latex ? ? ?Review of Systems ?Review of Systems  ?Musculoskeletal:  Positive for myalgias (R axillary pain).  ?All other systems reviewed and are negative. ? ? ?Physical Exam ?Triage Vital Signs ?ED Triage Vitals  ?Enc Vitals Group  ?   BP 02/21/22 1135 131/83  ?   Pulse Rate 02/21/22 1135 62  ?   Resp 02/21/22 1135 18  ?   Temp 02/21/22 1135 98.3 ?F (36.8 ?C)  ?   Temp Source 02/21/22 1135 Oral  ?   SpO2 02/21/22 1135 98 %  ?   Weight --   ?   Height --   ?   Head Circumference --   ?   Peak Flow --   ?   Pain Score 02/21/22 1140 6  ?   Pain Loc --   ?   Pain Edu? --   ?   Excl. in GC? --   ? ?No data found. ? ?Updated Vital Signs ?BP 131/83 (BP Location: Left Arm)   Pulse 62   Temp 98.3 ?F (36.8 ?C) (Oral)   Resp 18    SpO2 98%  ? ?Visual Acuity ?Right Eye Distance:   ?Left Eye Distance:   ?Bilateral Distance:   ? ?Right Eye Near:   ?Left Eye Near:    ?Bilateral Near:    ? ?Physical Exam ?Vitals and nursing note reviewed.  ?Constitutional:   ?   General: He is not in acute distress. ?   Appearance: Normal appearance. He is well-developed and normal weight. He is not ill-appearing, toxic-appearing or diaphoretic.  ?HENT:  ?   Head: Normocephalic and atraumatic.  ?Eyes:  ?   Extraocular Movements: Extraocular movements intact.  ?   Conjunctiva/sclera: Conjunctivae normal.  ?   Pupils: Pupils are equal, round, and reactive to light.  ?Cardiovascular:  ?   Rate and Rhythm: Normal rate and regular rhythm.  ?   Heart sounds: No murmur heard. ?Pulmonary:  ?   Effort: Pulmonary effort is normal. No respiratory distress.  ?   Breath sounds: Normal breath sounds.  ?Musculoskeletal:     ?   General: No swelling, tenderness, deformity or signs of injury. Normal range of motion.  ?   Right shoulder: Normal. No swelling, deformity, effusion, laceration, tenderness, bony tenderness or crepitus. Normal range of motion. Normal strength. Normal pulse.  ?   Left shoulder: Normal. No swelling, deformity, effusion, laceration, tenderness, bony tenderness or crepitus. Normal range of motion. Normal strength. Normal pulse.  ?   Right upper arm: Normal. No swelling, edema, deformity, lacerations, tenderness or bony tenderness.  ?   Left upper arm: Normal. No swelling, edema, deformity, lacerations, tenderness or bony tenderness.  ?   Right elbow: Normal. No swelling, deformity, effusion or lacerations. Normal range of motion. No tenderness.  ?   Left elbow: Normal. No swelling, deformity, effusion or lacerations. Normal range of motion. No tenderness.  ?   Right forearm: Normal.  ?   Left forearm: Normal.  ?   Right wrist: Normal.  ?   Left wrist: Normal.  ?   Right hand: Normal. No tenderness or bony tenderness. Normal range of motion. Normal  strength. Normal sensation. Normal capillary refill. Normal pulse.  ?   Left hand: Normal. No tenderness or bony tenderness. Normal range of motion. Normal strength. Normal sensation. Normal capillary refill. Normal pulse.  ?   Cervical back: Normal range of motion and  neck supple. No rigidity or tenderness.  ?   Right lower leg: No edema.  ?   Left lower leg: No edema.  ?Lymphadenopathy:  ?   Cervical: No cervical adenopathy.  ?Skin: ?   General: Skin is warm and dry.  ?   Capillary Refill: Capillary refill takes less than 2 seconds.  ?   Coloration: Skin is not jaundiced or pale.  ?   Findings: No bruising, erythema, lesion or rash.  ?Neurological:  ?   General: No focal deficit present.  ?   Mental Status: He is alert and oriented to person, place, and time.  ?   Sensory: No sensory deficit.  ?   Motor: No weakness.  ?Psychiatric:     ?   Mood and Affect: Mood normal.  ? ? ? ?UC Treatments / Results  ?Labs ?(all labs ordered are listed, but only abnormal results are displayed) ?Labs Reviewed  ?CBC WITH DIFFERENTIAL/PLATELET  ? ? ?EKG ? ? ?Radiology ?No results found. ? ?Procedures ?Procedures (including critical care time) ? ?Medications Ordered in UC ?Medications - No data to display ? ?Initial Impression / Assessment and Plan / UC Course  ?I have reviewed the triage vital signs and the nursing notes. ? ?Pertinent labs & imaging results that were available during my care of the patient were reviewed by me and considered in my medical decision making (see chart for details). ? ?  ? ?Right axillary pain - differentials discussed with pt. CBC drawn today to assess for possible infectious vs lymph node causes. Pt denies typical "B symptoms", therefore lymphoma unlikely. I am unable to palpate any swollen lymph nodes in the axilla or arm. Pt does appear physically fit, it is possible that it is a pectoralis strain. There is no visibile abnormalities at the stated site of pain. CBC today, recommended avoiding gym x 1  week, moist heat to area and NSAID BID x 5 days. If symptoms persist, recommended full workup from PCP. ? ?Final Clinical Impressions(s) / UC Diagnoses  ? ?Final diagnoses:  ?Axillary pain, right  ? ? ? ?Discharge Instru

## 2022-02-21 NOTE — Discharge Instructions (Addendum)
You had a CBC drawn today to assess for possible causes of your armpit pain. ?Please apply moist heat (such as a microwaveable heating pack or warm wet washcloth) to your armpit/ chest are. ?Take naproxen twice daily with food to see if this helps resolve the pain. ?We will call with results of your labs.  ?If your symptoms persist, or new or worsening symptoms, please follow up with your PCP for a complete workup ? ?

## 2022-02-22 LAB — CBC WITH DIFFERENTIAL/PLATELET
Basophils Absolute: 0 10*3/uL (ref 0.0–0.2)
Basos: 1 %
EOS (ABSOLUTE): 0 10*3/uL (ref 0.0–0.4)
Eos: 1 %
Hematocrit: 46 % (ref 37.5–51.0)
Hemoglobin: 15 g/dL (ref 13.0–17.7)
Immature Grans (Abs): 0 10*3/uL (ref 0.0–0.1)
Immature Granulocytes: 0 %
Lymphocytes Absolute: 1.8 10*3/uL (ref 0.7–3.1)
Lymphs: 40 %
MCH: 26.6 pg (ref 26.6–33.0)
MCHC: 32.6 g/dL (ref 31.5–35.7)
MCV: 82 fL (ref 79–97)
Monocytes Absolute: 0.5 10*3/uL (ref 0.1–0.9)
Monocytes: 10 %
Neutrophils Absolute: 2.1 10*3/uL (ref 1.4–7.0)
Neutrophils: 48 %
Platelets: 247 10*3/uL (ref 150–450)
RBC: 5.63 x10E6/uL (ref 4.14–5.80)
RDW: 14.7 % (ref 11.6–15.4)
WBC: 4.4 10*3/uL (ref 3.4–10.8)

## 2022-03-09 ENCOUNTER — Encounter: Payer: Self-pay | Admitting: Emergency Medicine

## 2022-03-09 ENCOUNTER — Ambulatory Visit
Admission: EM | Admit: 2022-03-09 | Discharge: 2022-03-09 | Disposition: A | Discharge status: Home / Self Care | Payer: BC Managed Care – PPO | Attending: Emergency Medicine | Admitting: Emergency Medicine

## 2022-03-09 DIAGNOSIS — R067 Sneezing: Secondary | ICD-10-CM

## 2022-03-09 DIAGNOSIS — B349 Viral infection, unspecified: Secondary | ICD-10-CM

## 2022-03-09 DIAGNOSIS — Z1152 Encounter for screening for COVID-19: Secondary | ICD-10-CM

## 2022-03-09 NOTE — ED Triage Notes (Signed)
Pt presents with sneezing since yesterday.  ?

## 2022-03-09 NOTE — ED Provider Notes (Signed)
?[facility] ? ? ? ?CSN: [id number] ?Arrival date & time: 03/09/22  1224 ? ? ?  ? ?History   ?Chief Complaint ?Chief Complaint  ?Patient presents with  ? Sneezing  ? ? ?HPI ?Willie Punnson S Stipes Sr. is a 46 y.o. male.  Patient presents with 1 day history of sneezing, fatigue, chills.  He reports 1 episode of loose stool today.  He also has developed postnasal drip and nonproductive cough today.  He denies fever, rash, shortness of breath, vomiting, or other symptoms.  He had a negative COVID test at home but is agreeable to PCR testing.  No treatment at home.  His medical history includes seasonal allergies, chronic rhinitis, eczema, mood disorder, GERD.  Patient was seen by an allergist last fall and states he tested negative for any allergies. ? ?The history is provided by the patient and medical records.  ? ?Past Medical History:  ?Diagnosis Date  ? Eczema   ? History of anal fissures 2015  ? History of viral meningitis 1995  ? Right ureteral stone   ? Seasonal allergies   ? Wears glasses   ? ? ?Patient Active Problem List  ? Diagnosis Date Noted  ? Chronic rhinitis 08/31/2021  ? Other adverse food reactions, not elsewhere classified, subsequent encounter 08/31/2021  ? 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 GERD (gastroesophageal reflux disease) 08/20/2018  ? Mood disorder (HCC) 08/20/2018  ? Seasonal allergic rhinitis 03/02/2017  ? ? ?Past Surgical History:  ?Procedure Laterality Date  ? ESOPHAGOGASTRODUODENOSCOPY  08/01/2012  ? URETEROSCOPY WITH HOLMIUM LASER LITHOTRIPSY Right 10/07/2019  ? Procedure: URETEROSCOPY / STENT PLACEMENT STONE BASKET RETRIVAL;  Surgeon: Ihor Gullyttelin, Mark, MD;  [facility];  Service: Urology;  Laterality: Right;  ? WISDOM TOOTH EXTRACTION    ? ? ? ? ? ?Home Medications   ? ?Prior to Admission medications   ?Medication Sig Start Date End Date Taking? Authorizing Provider  ?ipratropium (ATROVENT) 0.06 % nasal spray SMARTSIG:2 Spray(s) Both Nares Twice Daily PRN 11/29/21   Yes [provider]  ?azelastine (ASTELIN) 0.1 % nasal spray Place 1-2 sprays into both nostrils 2 (two) times daily as needed (nasal drainage). Use in each nostril as directed 08/31/21   Ellamae SiaKim, Yoon M, DO  ?levocetirizine (XYZAL) 5 MG tablet Take 5 mg by mouth every evening.    [provider]  ? ? ?Family History ?Family History  ?Problem Relation Age of Onset  ? Cancer Mother   ? Prostate cancer Father   ? Prostate cancer Paternal Uncle   ? Prostate cancer Paternal Grandfather   ? Cancer Paternal Grandfather   ? Colon cancer Neg Hx   ? Allergic rhinitis Neg Hx   ? Asthma Neg Hx   ? Eczema Neg Hx   ? Urticaria Neg Hx   ? ? ?Social History ?Social History  ? ?Tobacco Use  ? Smoking status: Never  ? Smokeless tobacco: Never  ?Vaping Use  ? Vaping Use: Never used  ?Substance Use Topics  ? Alcohol use: Yes  ?  Comment: occasional  ? Drug use: Never  ? ? ? ?Allergies   ?Latex ? ? ?Review of Systems ?Review of Systems  ?Constitutional:  Positive for chills and fatigue. Negative for fever.  ?HENT:  Positive for postnasal drip and sneezing. Negative for ear pain and sore throat.   ?Respiratory:  Positive for cough. Negative for shortness of breath.   ?Cardiovascular:  Negative for chest pain and palpitations.  ?Gastrointestinal:  Positive for  diarrhea. Negative for nausea and vomiting.  ?Skin:  Negative for color change and rash.  ?All other systems reviewed and are negative. ? ? ?Physical Exam ?Triage Vital Signs ?ED Triage Vitals  ?Enc Vitals Group  ?   BP   ?   Pulse   ?   Resp   ?   Temp   ?   Temp src   ?   SpO2   ?   Weight   ?   Height   ?   Head Circumference   ?   Peak Flow   ?   Pain Score   ?   Pain Loc   ?   Pain Edu?   ?   Excl. in GC?   ? ?No data found. ? ?Updated Vital Signs ?BP (!) 143/82   Pulse 60   Temp 98.4 ?F (36.9 ?C)   Resp 18   SpO2 98%  ? ?Visual Acuity ?Right Eye Distance:   ?Left Eye Distance:   ?Bilateral Distance:   ? ?Right Eye Near:   ?Left Eye Near:    ?Bilateral  Near:    ? ?Physical Exam ?Vitals and nursing note reviewed.  ?Constitutional:   ?   General: He is not in acute distress. ?   Appearance: Normal appearance. He is well-developed. He is not ill-appearing.  ?HENT:  ?   Right Ear: Tympanic membrane normal.  ?   Left Ear: Tympanic membrane normal.  ?   Nose: Nose normal.  ?   Mouth/Throat:  ?   Mouth: Mucous membranes are moist.  ?   Pharynx: Oropharynx is clear.  ?   Comments: Clear postnasal drip.  ?Cardiovascular:  ?   Rate and Rhythm: Normal rate and regular rhythm.  ?   Heart sounds: Normal heart sounds.  ?Pulmonary:  ?   Effort: Pulmonary effort is normal. No respiratory distress.  ?   Breath sounds: Normal breath sounds.  ?Musculoskeletal:  ?   Cervical back: Neck supple.  ?Skin: ?   General: Skin is warm and dry.  ?Neurological:  ?   Mental Status: He is alert.  ?Psychiatric:     ?   Mood and Affect: Mood normal.     ?   Behavior: Behavior normal.  ? ? ? ?UC Treatments / Results  ?Labs ?(all labs ordered are listed, but only abnormal results are displayed) ?Labs Reviewed  ?NOVEL CORONAVIRUS, NAA  ? ? ?EKG ? ? ?Radiology ?No results found. ? ?Procedures ?Procedures (including critical care time) ? ?Medications Ordered in UC ?Medications - No data to display ? ?Initial Impression / Assessment and Plan / UC Course  ?I have reviewed the triage vital signs and the nursing notes. ? ?Pertinent labs & imaging results that were available during my care of the patient were reviewed by me and considered in my medical decision making (see chart for details). ? ?Sneezing, viral illness, COVID test.  COVID pending.  Discussed symptomatic treatment including Flonase, Zyrtec, Tylenol or ibuprofen, rest, hydration.  Instructed patient to follow up with PCP if symptoms are not improving.  Patient agrees to plan of care. ? ? ? ?Final Clinical Impressions(s) / UC Diagnoses  ? ?Final diagnoses:  ?Encounter for screening for COVID-19  ?Sneezing  ?Viral illness  ? ? ? ?Discharge  Instructions   ? ?  ?Your COVID test is pending.   ? ?Use Flonase nasal spray and take Zyrtec as directed.  Take Tylenol or ibuprofen as needed for fever  or discomfort.  Rest and keep yourself hydrated.   ? ?Follow-up with your primary care provider if your symptoms are not improving.   ? ? ? ? ? ? ?ED Prescriptions   ?None ?  ? ?PDMP not reviewed this encounter. ?  ?Mickie Bail, NP ?03/09/22 1314 ? ?

## 2022-03-09 NOTE — Discharge Instructions (Addendum)
Your COVID test is pending.   ? ?Use Flonase nasal spray and take Zyrtec as directed.  Take Tylenol or ibuprofen as needed for fever or discomfort.  Rest and keep yourself hydrated.   ? ?Follow-up with your primary care provider if your symptoms are not improving.   ? ? ?

## 2022-03-10 LAB — NOVEL CORONAVIRUS, NAA: SARS-CoV-2, NAA: NOT DETECTED

## 2022-03-16 DIAGNOSIS — F4320 Adjustment disorder, unspecified: Secondary | ICD-10-CM | POA: Diagnosis not present

## 2022-04-29 DIAGNOSIS — F4320 Adjustment disorder, unspecified: Secondary | ICD-10-CM | POA: Diagnosis not present

## 2022-05-09 ENCOUNTER — Ambulatory Visit
Admission: EM | Admit: 2022-05-09 | Discharge: 2022-05-09 | Disposition: A | Discharge status: Home / Self Care | Payer: BC Managed Care – PPO | Attending: Emergency Medicine | Admitting: Emergency Medicine

## 2022-05-09 DIAGNOSIS — H9311 Tinnitus, right ear: Secondary | ICD-10-CM

## 2022-05-09 DIAGNOSIS — H6981 Other specified disorders of Eustachian tube, right ear: Secondary | ICD-10-CM | POA: Diagnosis not present

## 2022-05-09 MED ORDER — GUAIFENESIN ER 600 MG PO TB12: 1200.0000 mg | ORAL_TABLET | Freq: Two times a day (BID) | ORAL | 0 refills | Status: DC | PRN

## 2022-05-09 NOTE — ED Provider Notes (Signed)
[name]    CSN: [id number] Arrival date & time: 05/09/22  1143      History   Chief Complaint Chief Complaint  Patient presents with   Tinnitus    HPI Willie Farmer Sr. is a 46 y.o. male.  Patient presents with "ringing" in his right ear x 2-3 weeks.  No falls or injury.  He denies ear pain, fever, chills, sore throat, cough, shortness of breath, or other symptoms.  No treatments at home.  His medical history includes GERD, eczema, chronic rhinitis, mood disorder.  The history is provided by the patient and medical records.    Past Medical History:  Diagnosis Date   Eczema    History of anal fissures 2015   History of viral meningitis 1995   Right ureteral stone    Seasonal allergies    Wears glasses     Patient Active Problem List   Diagnosis Date Noted   Chronic rhinitis 08/31/2021   Other adverse food reactions, not elsewhere classified, subsequent encounter 08/31/2021   Oral aphthous ulcer 02/26/2020   GERD (gastroesophageal reflux disease) 08/20/2018   Mood disorder (HCC) 08/20/2018   Seasonal allergic rhinitis 03/02/2017    Past Surgical History:  Procedure Laterality Date   ESOPHAGOGASTRODUODENOSCOPY  08/01/2012   URETEROSCOPY WITH HOLMIUM LASER LITHOTRIPSY Right 10/07/2019   Procedure: URETEROSCOPY / STENT PLACEMENT STONE BASKET RETRIVAL;  Surgeon: Ihor Gully, MD;  Location: [facility] [place];  Service: Urology;  Laterality: Right;   WISDOM TOOTH EXTRACTION         Home Medications    Prior to Admission medications   Medication Sig Start Date End Date Taking? Authorizing Provider  guaiFENesin (MUCINEX) 600 MG 12 hr tablet Take 2 tablets (1,200 mg total) by mouth 2 (two) times daily as needed. 05/09/22  Yes Mickie Bail, NP  azelastine (ASTELIN) 0.1 % nasal spray Place 1-2 sprays into both nostrils 2 (two) times daily as needed (nasal drainage). Use in each nostril as directed 08/31/21   Ellamae Sia, DO  ipratropium  (ATROVENT) 0.06 % nasal spray SMARTSIG:2 Spray(s) Both Nares Twice Daily PRN 11/29/21   [provider]  levocetirizine (XYZAL) 5 MG tablet Take 5 mg by mouth every evening.    [provider]    Family History Family History  Problem Relation Age of Onset   Cancer Mother    Prostate cancer Father    Prostate cancer Paternal Uncle    Prostate cancer Paternal Grandfather    Cancer Paternal Grandfather    Colon cancer Neg Hx    Allergic rhinitis Neg Hx    Asthma Neg Hx    Eczema Neg Hx    Urticaria Neg Hx     Social History Social History   Tobacco Use   Smoking status: Never   Smokeless tobacco: Never  Vaping Use   Vaping Use: Never used  Substance Use Topics   Alcohol use: Yes    Comment: occasional   Drug use: Never     Allergies   Latex   Review of Systems Review of Systems  Constitutional:  Negative for chills and fever.  HENT:  Negative for ear discharge, ear pain, hearing loss and sore throat.        Ringing in right ear  Respiratory:  Negative for cough and shortness of breath.   All other systems reviewed and are negative.    Physical Exam Triage Vital Signs ED Triage Vitals  Enc Vitals Group  BP 05/09/22 1152 131/84     Pulse Rate 05/09/22 1152 70     Resp 05/09/22 1152 18     Temp 05/09/22 1152 97.9 F (36.6 C)     Temp src --      SpO2 05/09/22 1152 98 %     Weight --      Height --      Head Circumference --      Peak Flow --      Pain Score 05/09/22 1151 0     Pain Loc --      Pain Edu? --      Excl. in GC? --    No data found.  Updated Vital Signs BP 131/84   Pulse 70   Temp 97.9 F (36.6 C)   Resp 18   SpO2 98%   Visual Acuity Right Eye Distance:   Left Eye Distance:   Bilateral Distance:    Right Eye Near:   Left Eye Near:    Bilateral Near:     Physical Exam Vitals and nursing note reviewed.  Constitutional:      General: He is not in acute distress.    Appearance: Normal appearance. He is  well-developed. He is not ill-appearing.  HENT:     Right Ear: Tympanic membrane and ear canal normal.     Left Ear: Tympanic membrane and ear canal normal.     Nose: Nose normal.     Mouth/Throat:     Mouth: Mucous membranes are moist.     Pharynx: Oropharynx is clear.  Cardiovascular:     Rate and Rhythm: Normal rate and regular rhythm.     Heart sounds: Normal heart sounds.  Pulmonary:     Effort: Pulmonary effort is normal. No respiratory distress.     Breath sounds: Normal breath sounds.  Musculoskeletal:     Cervical back: Neck supple.  Skin:    General: Skin is warm and dry.  Neurological:     Mental Status: He is alert.  Psychiatric:        Mood and Affect: Mood normal.        Behavior: Behavior normal.      UC Treatments / Results  Labs (all labs ordered are listed, but only abnormal results are displayed) Labs Reviewed - No data to display  EKG   Radiology No results found.  Procedures Procedures (including critical care time)  Medications Ordered in UC Medications - No data to display  Initial Impression / Assessment and Plan / UC Course  I have reviewed the triage vital signs and the nursing notes.  Pertinent labs & imaging results that were available during my care of the patient were reviewed by me and considered in my medical decision making (see chart for details).  Right ear tinnitus, dysfunction of right eustachian tube.  Treating with Mucinex and ibuprofen.  Instructed patient to follow-up with his PCP or ENT if his symptoms are not improving.  Education provided on tinnitus and eustachian tube dysfunction.  Patient agrees to plan of care.   Final Clinical Impressions(s) / UC Diagnoses   Final diagnoses:  Tinnitus of right ear  Dysfunction of right eustachian tube     Discharge Instructions      Take Mucinex and ibuprofen as directed.  Follow up with your primary care provider or ENT if your symptoms are not improving.        ED  Prescriptions     Medication Sig Dispense Auth. Provider  guaiFENesin (MUCINEX) 600 MG 12 hr tablet Take 2 tablets (1,200 mg total) by mouth 2 (two) times daily as needed. 28 tablet Mickie Bail, NP      PDMP not reviewed this encounter.   Mickie Bail, NP 05/09/22 [phone]

## 2022-05-20 DIAGNOSIS — F4320 Adjustment disorder, unspecified: Secondary | ICD-10-CM | POA: Diagnosis not present

## 2022-07-06 DIAGNOSIS — J310 Chronic rhinitis: Secondary | ICD-10-CM | POA: Diagnosis not present

## 2022-07-06 DIAGNOSIS — H9311 Tinnitus, right ear: Secondary | ICD-10-CM | POA: Diagnosis not present

## 2022-07-06 DIAGNOSIS — J342 Deviated nasal septum: Secondary | ICD-10-CM | POA: Diagnosis not present

## 2022-07-06 DIAGNOSIS — J343 Hypertrophy of nasal turbinates: Secondary | ICD-10-CM | POA: Diagnosis not present

## 2022-07-06 DIAGNOSIS — R0982 Postnasal drip: Secondary | ICD-10-CM | POA: Diagnosis not present

## 2022-07-06 DIAGNOSIS — H6981 Other specified disorders of Eustachian tube, right ear: Secondary | ICD-10-CM | POA: Diagnosis not present

## 2022-07-14 DIAGNOSIS — F4320 Adjustment disorder, unspecified: Secondary | ICD-10-CM | POA: Diagnosis not present

## 2022-07-26 ENCOUNTER — Encounter: Payer: Self-pay | Admitting: Emergency Medicine

## 2022-07-26 ENCOUNTER — Ambulatory Visit
Admission: EM | Admit: 2022-07-26 | Discharge: 2022-07-26 | Disposition: A | Discharge status: Home / Self Care | Payer: BC Managed Care – PPO | Attending: Urgent Care | Admitting: Urgent Care

## 2022-07-26 DIAGNOSIS — S0181XA Laceration without foreign body of other part of head, initial encounter: Secondary | ICD-10-CM
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
## 2022-07-26 NOTE — Discharge Instructions (Signed)
Watch for signs and symptoms of infection including worsening pain at the site, redness, swelling, discharge.  Avoid touching the site.  Do not pick at the glue.  The glue will fall off at an appropriate time.  Please be aware that the laceration edges will remain weaker than normal for several weeks even if it appears the skin is healed.  Follow-up here or with your primary care provider if you have any concerns.

## 2022-07-27 DIAGNOSIS — F4320 Adjustment disorder, unspecified: Secondary | ICD-10-CM | POA: Diagnosis not present

## 2022-10-19 DIAGNOSIS — J310 Chronic rhinitis: Secondary | ICD-10-CM | POA: Diagnosis not present

## 2022-10-19 DIAGNOSIS — J343 Hypertrophy of nasal turbinates: Secondary | ICD-10-CM | POA: Diagnosis not present

## 2022-10-19 DIAGNOSIS — R0982 Postnasal drip: Secondary | ICD-10-CM | POA: Diagnosis not present

## 2022-12-07 ENCOUNTER — Ambulatory Visit
Admission: EM | Admit: 2022-12-07 | Discharge: 2022-12-07 | Disposition: A | Discharge status: Home / Self Care | Payer: BC Managed Care – PPO | Attending: Urgent Care | Admitting: Urgent Care

## 2022-12-07 DIAGNOSIS — R509 Fever, unspecified: Secondary | ICD-10-CM | POA: Diagnosis not present

## 2022-12-07 DIAGNOSIS — U071 COVID-19: Secondary | ICD-10-CM | POA: Insufficient documentation

## 2022-12-07 DIAGNOSIS — R6889 Other general symptoms and signs: Secondary | ICD-10-CM | POA: Insufficient documentation

## 2022-12-07 MED ORDER — OSELTAMIVIR PHOSPHATE 75 MG PO CAPS: 75.0000 mg | ORAL_CAPSULE | Freq: Two times a day (BID) | ORAL | 0 refills | Status: DC

## 2022-12-07 NOTE — ED Triage Notes (Signed)
Pt. Presents to UC w/ c/o a fever, emesis, body aches  and nasal drainage that started 2 days ago. Pt. States he was exposed to [place].

## 2022-12-07 NOTE — Discharge Instructions (Signed)
You have been diagnosed with a viral upper respiratory infection based on your symptoms and exam. Viral illnesses cannot be treated with antibiotics - they are self limiting - and you should find your symptoms resolving within a few days. Get plenty of rest and non-caffeinated fluids. Watch for signs of dehydration including reduced urine output and dark colored urine.  We have performed a respiratory swab testing for COVID. I have prescribed Tamiflu, antiviral therapy for influenza A, based on a presumptive diagnosis of influenza.  Someone will contact you after results of your swab are available with instructions to continue or stop this medication. If the results of this testing are positive, someone will call you if you are eligible for any antiviral treatment.    We recommend you use over-the-counter medications for symptom control including acetaminophen (Tylenol), ibuprofen (Advil/Motrin) or naproxen (Aleve) for throat pain, fever, chills or body aches. You may combine use of acetaminophen and ibuprofen/naproxen if needed.  Some patients find an pain-relieving throat spray such as Chloraseptic to be effective.  Also recommend cold/cough medication containing a cough suppressant such as dextromethorphan, as needed. Please note that some cough medications are not recommended if you suffer from hypertension.    Saline mist spray is helpful for removing excess mucus from your nose.  Room humidifiers are helpful to ease breathing at night. I recommend guaifenesin (Mucinex) with plenty of water throughout the day to help thin and loosen mucus secretions in your respiratory passages.   If appropriate based upon your other medical problems, you might also find relief of nasal/sinus congestion symptoms by using a nasal decongestant such as fluticasone (Flonase ) or pseudoephedrine (Sudafed sinus).  You will need to obtain Sudafed from behind the pharmacist counter.  Speak to the pharmacist to verify that you  are not duplicating medications with other over-the-counter formulations that you may be using.     

## 2022-12-07 NOTE — ED Provider Notes (Signed)
[facility]    CSN: [id number] Arrival date & time: 12/07/22  0940      History   Chief Complaint Chief Complaint  Patient presents with   Fever   Generalized Body Aches   Emesis    HPI Willie Farmer Sr. is a 47 y.o. male.    Fever Associated symptoms: vomiting   Emesis Associated symptoms: fever     Patient presents to urgent care with complaint of fever, vomiting, body aches, nasal drainage starting 2 days ago.  Endorses exposure to COVID positive individual. Also endorses direct exposure to someone with "flu like symtpoms".  He states he believes the episode of vomiting was related to overproduction of mucus.  No relevant past medical history.  Past Medical History:  Diagnosis Date   Eczema    History of anal fissures 2015   History of viral meningitis 1995   Right ureteral stone    Seasonal allergies    Wears glasses     Patient Active Problem List   Diagnosis Date Noted   Chronic rhinitis 08/31/2021   Other adverse food reactions, not elsewhere classified, subsequent encounter 08/31/2021   Oral aphthous ulcer 02/26/2020   GERD (gastroesophageal reflux disease) 08/20/2018   Mood disorder ([place]) 08/20/2018   Seasonal allergic rhinitis 03/02/2017    Past Surgical History:  Procedure Laterality Date   ESOPHAGOGASTRODUODENOSCOPY  08/01/2012   URETEROSCOPY WITH HOLMIUM LASER LITHOTRIPSY Right 10/07/2019   Procedure: URETEROSCOPY / STENT PLACEMENT STONE BASKET RETRIVAL;  Surgeon: Kathie Rhodes, MD;  Location: [place];  Service: Urology;  Laterality: Right;   WISDOM TOOTH EXTRACTION         Home Medications    Prior to Admission medications   Medication Sig Start Date End Date Taking? Authorizing Provider  azelastine (ASTELIN) 0.1 % nasal spray Place 1-2 sprays into both nostrils 2 (two) times daily as needed (nasal drainage). Use in each nostril as directed 08/31/21   Garnet Sierras, DO  guaiFENesin (MUCINEX) 600 MG 12 hr  tablet Take 2 tablets (1,200 mg total) by mouth 2 (two) times daily as needed. 05/09/22   Sharion Balloon, NP  ipratropium (ATROVENT) 0.06 % nasal spray SMARTSIG:2 Spray(s) Both Nares Twice Daily PRN 11/29/21   [provider]  levocetirizine (XYZAL) 5 MG tablet Take 5 mg by mouth every evening.    [provider]    Family History Family History  Problem Relation Age of Onset   Cancer Mother    Prostate cancer Father    Prostate cancer Paternal Uncle    Prostate cancer Paternal Grandfather    Cancer Paternal Grandfather    Colon cancer Neg Hx    Allergic rhinitis Neg Hx    Asthma Neg Hx    Eczema Neg Hx    Urticaria Neg Hx     Social History Social History   Tobacco Use   Smoking status: Never   Smokeless tobacco: Never  Vaping Use   Vaping Use: Never used  Substance Use Topics   Alcohol use: Yes    Comment: occasional   Drug use: Never     Allergies   Latex   Review of Systems Review of Systems  Constitutional:  Positive for fever.  Gastrointestinal:  Positive for vomiting.     Physical Exam Triage Vital Signs ED Triage Vitals  Enc Vitals Group     BP 12/07/22 0952 114/77     Pulse Rate 12/07/22 0952 70  Resp 12/07/22 0952 18     Temp 12/07/22 0952 99 F (37.2 C)     Temp Source 12/07/22 0952 Oral     SpO2 12/07/22 0952 95 %     Weight --      Height --      Head Circumference --      Peak Flow --      Pain Score 12/07/22 0955 0     Pain Loc --      Pain Edu? --      Excl. in [place]? --    No data found.  Updated Vital Signs BP 114/77 (BP Location: Left Arm)   Pulse 70   Temp 99 F (37.2 C) (Oral)   Resp 18   SpO2 95%   Visual Acuity Right Eye Distance:   Left Eye Distance:   Bilateral Distance:    Right Eye Near:   Left Eye Near:    Bilateral Near:     Physical Exam Vitals reviewed.  Constitutional:      Appearance: Normal appearance. He is ill-appearing.  HENT:     Nose: Congestion present.     Mouth/Throat:      Pharynx: Posterior oropharyngeal erythema present. No oropharyngeal exudate.  Eyes:     Conjunctiva/sclera: Conjunctivae normal.     Pupils: Pupils are equal, round, and reactive to light.  Cardiovascular:     Rate and Rhythm: Normal rate and regular rhythm.     Pulses: Normal pulses.  Pulmonary:     Effort: Pulmonary effort is normal.     Breath sounds: Normal breath sounds.  Skin:    General: Skin is warm and dry.  Neurological:     General: No focal deficit present.     Mental Status: He is alert and oriented to person, place, and time.  Psychiatric:        Mood and Affect: Mood normal.        Behavior: Behavior normal.      UC Treatments / Results  Labs (all labs ordered are listed, but only abnormal results are displayed) Labs Reviewed - No data to display  EKG   Radiology No results found.  Procedures Procedures (including critical care time)  Medications Ordered in UC Medications - No data to display  Initial Impression / Assessment and Plan / UC Course  I have reviewed the triage vital signs and the nursing notes.  Pertinent labs & imaging results that were available during my care of the patient were reviewed by me and considered in my medical decision making (see chart for details).   Patient is afebrile here without recent antipyretics. Satting well on room air. Overall is ill appearing, though well hydrated, without respiratory distress. Pulmonary exam is unremarkable.  Lungs CTAB without wheezing, rhonchi, rales.  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  He has nasal congestion.  Symptoms are consistent with an acute viral process including influenza and COVID.  Given his exposure to both, will treat with flu antivirals since he is within the treatment window while still waiting results of COVID swab.  Otherwise continue to recommend use of OTC medication for symptom control.  Final Clinical Impressions(s) / UC Diagnoses   Final  diagnoses:  None   Discharge Instructions   None    ED Prescriptions   None    PDMP not reviewed this encounter.   Rose Phi, [place] 12/07/22 1007

## 2022-12-08 ENCOUNTER — Telehealth (HOSPITAL_COMMUNITY): Payer: Self-pay | Admitting: Emergency Medicine

## 2022-12-08 LAB — SARS CORONAVIRUS 2 (TAT 6-24 HRS): SARS Coronavirus 2: POSITIVE — AB

## 2022-12-08 MED ORDER — NIRMATRELVIR/RITONAVIR (PAXLOVID)TABLET: 3.0000 | ORAL_TABLET | Freq: Two times a day (BID) | ORAL | 0 refills | Status: AC

## 2023-03-27 ENCOUNTER — Ambulatory Visit
Admission: EM | Admit: 2023-03-27 | Discharge: 2023-03-27 | Disposition: A | Discharge status: Home / Self Care | Payer: BC Managed Care – PPO | Attending: Emergency Medicine | Admitting: Emergency Medicine

## 2023-03-27 DIAGNOSIS — B349 Viral infection, unspecified: Secondary | ICD-10-CM

## 2023-03-27 DIAGNOSIS — R051 Acute cough: Secondary | ICD-10-CM

## 2023-03-27 MED ORDER — BENZONATATE 100 MG PO CAPS: 100.0000 mg | ORAL_CAPSULE | Freq: Three times a day (TID) | ORAL | 0 refills | Status: DC | PRN

## 2023-03-27 MED ORDER — PROMETHAZINE-DM 6.25-15 MG/5ML PO SYRP: 5.0000 mL | ORAL_SOLUTION | Freq: Four times a day (QID) | ORAL | 0 refills | Status: DC | PRN

## 2023-03-27 NOTE — Discharge Instructions (Addendum)
Take Tessalon Perles or promethazine DM as directed.  Follow up with your primary care provider if your symptoms are not improving.

## 2023-03-27 NOTE — ED Provider Notes (Signed)
Willie Farmer    CSN: [id number] Arrival date & time: 03/27/23  1502      History   Chief Complaint Chief Complaint  Patient presents with   Cough   Nasal Congestion   Emesis    HPI Willie ELZA Sr. is a 47 y.o. male.  Patient presents with 3 day history of ear pain, congestion, cough, nausea, vomiting.  His back hurts when he coughs.  Treating with Allegra and azelastine nasal spray.  No fever, shortness of breath, chest pain, abdominal pain, diarrhea, or other symptoms.  His medical history includes seasonal allergies, chronic rhinitis, eczema.   The history is provided by the patient and medical records.    Past Medical History:  Diagnosis Date   Eczema    History of anal fissures 2015   History of viral meningitis 1995   Right ureteral stone    Seasonal allergies    Wears glasses     Patient Active Problem List   Diagnosis Date Noted   Chronic rhinitis 08/31/2021   Other adverse food reactions, not elsewhere classified, subsequent encounter 08/31/2021   Oral aphthous ulcer 02/26/2020   GERD (gastroesophageal reflux disease) 08/20/2018   Mood disorder (HCC) 08/20/2018   Seasonal allergic rhinitis 03/02/2017    Past Surgical History:  Procedure Laterality Date   ESOPHAGOGASTRODUODENOSCOPY  08/01/2012   URETEROSCOPY WITH HOLMIUM LASER LITHOTRIPSY Right 10/07/2019   Procedure: URETEROSCOPY / STENT PLACEMENT STONE BASKET RETRIVAL;  Surgeon: Ihor Gully, MD;  Location: [facility] ;  Service: Urology;  Laterality: Right;   WISDOM TOOTH EXTRACTION         Home Medications    Prior to Admission medications   Medication Sig Start Date End Date Taking? Authorizing Provider  benzonatate (TESSALON) 100 MG capsule Take 1 capsule (100 mg total) by mouth 3 (three) times daily as needed for cough. 03/27/23  Yes Mickie Bail, NP  promethazine-dextromethorphan (PROMETHAZINE-DM) 6.25-15 MG/5ML syrup Take 5 mLs by mouth 4 (four) times daily as  needed. 03/27/23  Yes Mickie Bail, NP  azelastine (ASTELIN) 0.1 % nasal spray Place 1-2 sprays into both nostrils 2 (two) times daily as needed (nasal drainage). Use in each nostril as directed 08/31/21   Ellamae Sia, DO  guaiFENesin (MUCINEX) 600 MG 12 hr tablet Take 2 tablets (1,200 mg total) by mouth 2 (two) times daily as needed. Patient not taking: Reported on 03/27/2023 05/09/22   Mickie Bail, NP  ipratropium (ATROVENT) 0.06 % nasal spray SMARTSIG:2 Spray(s) Both Nares Twice Daily PRN 11/29/21   [provider]  levocetirizine (XYZAL) 5 MG tablet Take 5 mg by mouth every evening.    [provider]  oseltamivir (TAMIFLU) 75 MG capsule Take 1 capsule (75 mg total) by mouth every 12 (twelve) hours. Patient not taking: Reported on 03/27/2023 12/07/22   Immordino, Jeannett Senior, FNP    Family History Family History  Problem Relation Age of Onset   Cancer Mother    Prostate cancer Father    Prostate cancer Paternal Uncle    Prostate cancer Paternal Grandfather    Cancer Paternal Grandfather    Colon cancer Neg Hx    Allergic rhinitis Neg Hx    Asthma Neg Hx    Eczema Neg Hx    Urticaria Neg Hx     Social History Social History   Tobacco Use   Smoking status: Never   Smokeless tobacco: Never  Vaping Use   Vaping Use: Never used  Substance  Use Topics   Alcohol use: Yes    Comment: occasional   Drug use: Never     Allergies   Latex   Review of Systems Review of Systems  Constitutional:  Negative for chills and fever.  HENT:  Positive for congestion, ear pain and sore throat.   Respiratory:  Positive for cough. Negative for shortness of breath.   Cardiovascular:  Negative for chest pain and palpitations.  Gastrointestinal:  Positive for nausea and vomiting. Negative for abdominal pain and diarrhea.  Musculoskeletal:  Positive for back pain.     Physical Exam Triage Vital Signs ED Triage Vitals [03/27/23 1515]  Enc Vitals Group     BP      Pulse Rate  91     Resp 18     Temp 98 F (36.7 C)     Temp src      SpO2 97 %     Weight      Height      Head Circumference      Peak Flow      Pain Score      Pain Loc      Pain Edu?      Excl. in GC?    No data found.  Updated Vital Signs BP 137/80   Pulse 91   Temp 98 F (36.7 C)   Resp 18   SpO2 97%   Visual Acuity Right Eye Distance:   Left Eye Distance:   Bilateral Distance:    Right Eye Near:   Left Eye Near:    Bilateral Near:     Physical Exam Vitals and nursing note reviewed.  Constitutional:      General: He is not in acute distress.    Appearance: Normal appearance. He is well-developed. He is not ill-appearing.  HENT:     Right Ear: Tympanic membrane normal.     Left Ear: Tympanic membrane normal.     Nose: Nose normal.     Mouth/Throat:     Mouth: Mucous membranes are moist.     Pharynx: Oropharynx is clear.  Cardiovascular:     Rate and Rhythm: Normal rate and regular rhythm.     Heart sounds: Normal heart sounds.  Pulmonary:     Effort: Pulmonary effort is normal. No respiratory distress.     Breath sounds: Normal breath sounds.  Abdominal:     General: Bowel sounds are normal.     Palpations: Abdomen is soft.     Tenderness: There is no abdominal tenderness. There is no guarding or rebound.  Musculoskeletal:     Cervical back: Neck supple.  Skin:    General: Skin is warm and dry.  Neurological:     Mental Status: He is alert.  Psychiatric:        Mood and Affect: Mood normal.        Behavior: Behavior normal.      UC Treatments / Results  Labs (all labs ordered are listed, but only abnormal results are displayed) Labs Reviewed - No data to display  EKG   Radiology No results found.  Procedures Procedures (including critical care time)  Medications Ordered in UC Medications - No data to display  Initial Impression / Assessment and Plan / UC Course  I have reviewed the triage vital signs and the nursing notes.  Pertinent  labs & imaging results that were available during my care of the patient were reviewed by me and considered in my medical decision  making (see chart for details).    Viral illness, cough.  Afebrile, VSS.  Patient is well-appearing and his exam is reassuring.  He declines treatment for nausea or vomiting.  Treating cough with Tessalon Perles or Promethazine DM.  Precautions for drowsiness with promethazine discussed.  Education provided on viral illness and cough.  Instructed patient to follow up with his PCP if his symptoms are not improving.  He agrees to plan of care.    Final Clinical Impressions(s) / UC Diagnoses   Final diagnoses:  Viral illness  Acute cough     Discharge Instructions      Take Tessalon Perles or promethazine DM as directed.  Follow up with your primary care provider if your symptoms are not improving.        ED Prescriptions     Medication Sig Dispense Auth. Provider   benzonatate (TESSALON) 100 MG capsule Take 1 capsule (100 mg total) by mouth 3 (three) times daily as needed for cough. 21 capsule Mickie Bail, NP   promethazine-dextromethorphan (PROMETHAZINE-DM) 6.25-15 MG/5ML syrup Take 5 mLs by mouth 4 (four) times daily as needed. 118 mL Mickie Bail, NP      PDMP not reviewed this encounter.   Mickie Bail, NP 03/27/23 [phone]

## 2023-03-27 NOTE — ED Triage Notes (Addendum)
Patient to Urgent Care with multiple complaints that started on Friday.   - Reports a knot present behind his right ear jaw. Reports when he coughs he can feel it in his upper back. States he first felt it this morning.  - Increased mucus production. - Nausea and emesis. Unable to eat.  - Using azelastine 0.1% spray/ allegra.  Reports that his sister-in-law is currently hospitalized and he visited her on Friday.

## 2023-07-10 ENCOUNTER — Ambulatory Visit
Admission: EM | Admit: 2023-07-10 | Discharge: 2023-07-10 | Disposition: A | Discharge status: Home / Self Care | Payer: BC Managed Care – PPO | Attending: Emergency Medicine | Admitting: Emergency Medicine

## 2023-07-10 DIAGNOSIS — R509 Fever, unspecified: Secondary | ICD-10-CM | POA: Insufficient documentation

## 2023-07-10 DIAGNOSIS — Z1152 Encounter for screening for COVID-19: Secondary | ICD-10-CM | POA: Insufficient documentation

## 2023-07-10 DIAGNOSIS — R5383 Other fatigue: Secondary | ICD-10-CM | POA: Diagnosis not present

## 2023-07-10 NOTE — Discharge Instructions (Signed)
Your symptoms today are most likely being caused by a virus and should steadily improve in time it can take up to 7 to 10 days before you truly start to see a turnaround however things will get better  Fatigue you may attempt use of vitamins or supplements to help boost immunity such as vitamin C,ashwagandha,  echinacea ect...  COVID test is pending up to 24 hours, you will be notified of positive test results only, if positive you will need to stay home if feeling feverish, if no fever you may continue activity wearing mask until symptoms resolved  Typically diarrhea fixes itself with time, if your diarrhea persists for an extended period of time such as greater than 5 days you may return to the clinic for further evaluation and we are able to test her stool and make adjustments based on results  Until diarrhea resolves increase your fluid intake through use of water or electrolyte replacement substances such as Gatorade or similar products to maintain your hydration, may continue to eat food as tolerated  You may attempt use of over-the-counter Imodium as needed    You can take Tylenol and/or Ibuprofen as needed for fever reduction and pain relief.   For cough: honey 1/2 to 1 teaspoon (you can dilute the honey in water or another fluid).  You can also use guaifenesin and dextromethorphan for cough. You can use a humidifier for chest congestion and cough.  If you don't have a humidifier, you can sit in the bathroom with the hot shower running.      For sore throat: try warm salt water gargles, cepacol lozenges, throat spray, warm tea or water with lemon/honey, popsicles or ice, or OTC cold relief medicine for throat discomfort.   For congestion: take a daily anti-histamine like Zyrtec, Claritin, and a oral decongestant, such as pseudoephedrine.  You can also use Flonase 1-2 sprays in each nostril daily.   It is important to stay hydrated: drink plenty of fluids (water,  gatorade/powerade/pedialyte, juices, or teas) to keep your throat moisturized and help further relieve irritation/discomfort.

## 2023-07-10 NOTE — ED Provider Notes (Signed)
[name]    CSN: [id number] Arrival date & time: 07/10/23  1315      History   Chief Complaint Chief Complaint  Patient presents with   Fatigue    HPI SHAURYA HARIRI Sr. is a 47 y.o. male.   Patient presents for evaluation of subjective fever, chills and fatigue beginning 2 days ago.  Also experiencing watery diarrhea.  Possible sick contacts.  Has not attempted treatment of symptoms.  Denies congestion, ear pain, sore throat, cough, shortness of breath, wheezing, abdominal pain, nausea and vomiting.  Tolerating food and liquids.  States he had a recent viral illness 2 to 3 weeks ago which resolved after approximately 7 days.  Past Medical History:  Diagnosis Date   Eczema    History of anal fissures 2015   History of viral meningitis 1995   Right ureteral stone    Seasonal allergies    Wears glasses     Patient Active Problem List   Diagnosis Date Noted   Chronic rhinitis 08/31/2021   Other adverse food reactions, not elsewhere classified, subsequent encounter 08/31/2021   Oral aphthous ulcer 02/26/2020   GERD (gastroesophageal reflux disease) 08/20/2018   Mood disorder (HCC) 08/20/2018   Seasonal allergic rhinitis 03/02/2017    Past Surgical History:  Procedure Laterality Date   ESOPHAGOGASTRODUODENOSCOPY  08/01/2012   URETEROSCOPY WITH HOLMIUM LASER LITHOTRIPSY Right 10/07/2019   Procedure: URETEROSCOPY / STENT PLACEMENT STONE BASKET RETRIVAL;  Surgeon: Ihor Gully, MD;  Location: [facility];  Service: Urology;  Laterality: Right;   WISDOM TOOTH EXTRACTION         Home Medications    Prior to Admission medications   Medication Sig Start Date End Date Taking? Authorizing Provider  azelastine (ASTELIN) 0.1 % nasal spray Place 1-2 sprays into both nostrils 2 (two) times daily as needed (nasal drainage). Use in each nostril as directed 08/31/21   Ellamae Sia, DO  benzonatate (TESSALON) 100 MG capsule Take 1 capsule (100 mg total)  by mouth 3 (three) times daily as needed for cough. Patient not taking: Reported on 07/10/2023 03/27/23   Mickie Bail, NP  guaiFENesin (MUCINEX) 600 MG 12 hr tablet Take 2 tablets (1,200 mg total) by mouth 2 (two) times daily as needed. Patient not taking: Reported on 03/27/2023 05/09/22   Mickie Bail, NP  ipratropium (ATROVENT) 0.06 % nasal spray SMARTSIG:2 Spray(s) Both Nares Twice Daily PRN 11/29/21   [provider]  levocetirizine (XYZAL) 5 MG tablet Take 5 mg by mouth every evening.    [provider]  oseltamivir (TAMIFLU) 75 MG capsule Take 1 capsule (75 mg total) by mouth every 12 (twelve) hours. Patient not taking: Reported on 03/27/2023 12/07/22   Immordino, Jeannett Senior, FNP  promethazine-dextromethorphan (PROMETHAZINE-DM) 6.25-15 MG/5ML syrup Take 5 mLs by mouth 4 (four) times daily as needed. Patient not taking: Reported on 07/10/2023 03/27/23   Mickie Bail, NP    Family History Family History  Problem Relation Age of Onset   Cancer Mother    Prostate cancer Father    Prostate cancer Paternal Uncle    Prostate cancer Paternal Grandfather    Cancer Paternal Grandfather    Colon cancer Neg Hx    Allergic rhinitis Neg Hx    Asthma Neg Hx    Eczema Neg Hx    Urticaria Neg Hx     Social History Social History   Tobacco Use   Smoking status: Never   Smokeless tobacco: Never  Vaping Use   Vaping status: Never Used  Substance Use Topics   Alcohol use: Yes    Comment: occasional   Drug use: Never     Allergies   Latex   Review of Systems Review of Systems   Physical Exam Triage Vital Signs ED Triage Vitals  Encounter Vitals Group     BP 07/10/23 1330 (!) 138/90     Systolic BP Percentile --      Diastolic BP Percentile --      Pulse Rate 07/10/23 1330 (!) 54     Resp 07/10/23 1330 18     Temp 07/10/23 1330 98 F (36.7 C)     Temp src --      SpO2 07/10/23 1330 98 %     Weight 07/10/23 1329 170 lb (77.1 kg)     Height 07/10/23 1329 6\' 3"   (1.905 m)     Head Circumference --      Peak Flow --      Pain Score 07/10/23 1321 0     Pain Loc --      Pain Education --      Exclude from Growth Chart --    No data found.  Updated Vital Signs BP (!) 138/90   Pulse (!) 54   Temp 98 F (36.7 C)   Resp 18   Ht 6\' 3"  (1.905 m)   Wt 170 lb (77.1 kg)   SpO2 98%   BMI 21.25 kg/m   Visual Acuity Right Eye Distance:   Left Eye Distance:   Bilateral Distance:    Right Eye Near:   Left Eye Near:    Bilateral Near:     Physical Exam Constitutional:      Appearance: Normal appearance.  HENT:     Head: Normocephalic.     Right Ear: Tympanic membrane, ear canal and external ear normal.     Left Ear: Tympanic membrane, ear canal and external ear normal.     Nose: Nose normal.     Mouth/Throat:     Mouth: Mucous membranes are moist.     Pharynx: Oropharynx is clear.  Eyes:     Extraocular Movements: Extraocular movements intact.  Cardiovascular:     Rate and Rhythm: Normal rate and regular rhythm.     Pulses: Normal pulses.     Heart sounds: Normal heart sounds.  Pulmonary:     Effort: Pulmonary effort is normal.     Breath sounds: Normal breath sounds.  Abdominal:     Tenderness: There is abdominal tenderness in the right lower quadrant.  Skin:    General: Skin is warm and dry.  Neurological:     Mental Status: He is alert and oriented to person, place, and time. Mental status is at baseline.      UC Treatments / Results  Labs (all labs ordered are listed, but only abnormal results are displayed) Labs Reviewed  SARS CORONAVIRUS 2 (TAT 6-24 HRS)    EKG   Radiology No results found.  Procedures Procedures (including critical care time)  Medications Ordered in UC Medications - No data to display  Initial Impression / Assessment and Plan / UC Course  I have reviewed the triage vital signs and the nursing notes.  Pertinent labs & imaging results that were available during my care of the patient were  reviewed by me and considered in my medical decision making (see chart for details).  Fever, fatigue  Vital signs are stable, patient is  in no signs of distress nontoxic-appearing, mild tenderness noted to the right lower quadrant, experiencing diarrhea, consistent with presentation, stable for outpatient management, COVID test pending, healthy adult with minimal comorbidities does not qualify for antivirals if positive, discussed quarantine per CDC, recommended use of increase fluid intake and Imodium as needed for diarrhea, may follow-up if symptoms worsen, advise use of vitamins and supplements for management of fatigue as well as over-the-counter medications if new symptoms occur, may follow-up with his urgent care as needed for further evaluation Final Clinical Impressions(s) / UC Diagnoses   Final diagnoses:  Fever, unspecified  Fatigue, unspecified type     Discharge Instructions      Your symptoms today are most likely being caused by a virus and should steadily improve in time it can take up to 7 to 10 days before you truly start to see a turnaround however things will get better  Fatigue you may attempt use of vitamins or supplements to help boost immunity such as vitamin C,ashwagandha,  echinacea ect...  COVID test is pending up to 24 hours, you will be notified of positive test results only, if positive you will need to stay home if feeling feverish, if no fever you may continue activity wearing mask until symptoms resolved  Typically diarrhea fixes itself with time, if your diarrhea persists for an extended period of time such as greater than 5 days you may return to the clinic for further evaluation and we are able to test her stool and make adjustments based on results  Until diarrhea resolves increase your fluid intake through use of water or electrolyte replacement substances such as Gatorade or similar products to maintain your hydration, may continue to eat food as  tolerated  You may attempt use of over-the-counter Imodium as needed    You can take Tylenol and/or Ibuprofen as needed for fever reduction and pain relief.   For cough: honey 1/2 to 1 teaspoon (you can dilute the honey in water or another fluid).  You can also use guaifenesin and dextromethorphan for cough. You can use a humidifier for chest congestion and cough.  If you don't have a humidifier, you can sit in the bathroom with the hot shower running.      For sore throat: try warm salt water gargles, cepacol lozenges, throat spray, warm tea or water with lemon/honey, popsicles or ice, or OTC cold relief medicine for throat discomfort.   For congestion: take a daily anti-histamine like Zyrtec, Claritin, and a oral decongestant, such as pseudoephedrine.  You can also use Flonase 1-2 sprays in each nostril daily.   It is important to stay hydrated: drink plenty of fluids (water, gatorade/powerade/pedialyte, juices, or teas) to keep your throat moisturized and help further relieve irritation/discomfort.    ED Prescriptions   None    PDMP not reviewed this encounter.   Valinda Hoar, NP 07/10/23 1410

## 2023-07-10 NOTE — ED Triage Notes (Signed)
Patient to Urgent Care with complaints of low energy/ fatigue/ "hot flashes".  Reports symptoms started  on Saturday. Denies any other symptoms. Possibly had a fever this afternoon.   Reports being sick 2-3 weeks ago with similar symptoms.

## 2023-07-11 LAB — SARS CORONAVIRUS 2 (TAT 6-24 HRS): SARS Coronavirus 2: NEGATIVE

## 2023-08-09 DIAGNOSIS — Z23 Encounter for immunization: Secondary | ICD-10-CM | POA: Diagnosis not present

## 2023-08-17 ENCOUNTER — Encounter: Payer: Self-pay | Admitting: Nurse Practitioner

## 2023-08-17 ENCOUNTER — Ambulatory Visit: Payer: BC Managed Care – PPO | Admitting: Nurse Practitioner

## 2023-08-17 VITALS — BP 130/88 | HR 56 | Temp 98.4°F | Ht 72.0 in | Wt 171.4 lb

## 2023-08-17 DIAGNOSIS — M6208 Separation of muscle (nontraumatic), other site: Secondary | ICD-10-CM | POA: Diagnosis not present

## 2023-08-17 DIAGNOSIS — Z1211 Encounter for screening for malignant neoplasm of colon: Secondary | ICD-10-CM | POA: Diagnosis not present

## 2023-08-17 DIAGNOSIS — Z125 Encounter for screening for malignant neoplasm of prostate: Secondary | ICD-10-CM

## 2023-08-17 DIAGNOSIS — F39 Unspecified mood [affective] disorder: Secondary | ICD-10-CM

## 2023-08-17 DIAGNOSIS — Z0000 Encounter for general adult medical examination without abnormal findings: Secondary | ICD-10-CM

## 2023-08-17 LAB — COMPREHENSIVE METABOLIC PANEL
ALT: 12 U/L (ref 0–53)
AST: 22 U/L (ref 0–37)
Albumin: 4.5 g/dL (ref 3.5–5.2)
Alkaline Phosphatase: 47 U/L (ref 39–117)
BUN: 13 mg/dL (ref 6–23)
CO2: 28 meq/L (ref 19–32)
Calcium: 9.6 mg/dL (ref 8.4–10.5)
Chloride: 106 meq/L (ref 96–112)
Creatinine, Ser: 1.28 mg/dL (ref 0.40–1.50)
GFR: 66.93 mL/min (ref >60.00)
Glucose, Bld: 90 mg/dL (ref 70–99)
Potassium: 4.2 meq/L (ref 3.5–5.1)
Sodium: 140 meq/L (ref 135–145)
Total Bilirubin: 0.4 mg/dL (ref 0.2–1.2)
Total Protein: 7.3 g/dL (ref 6.0–8.3)

## 2023-08-17 LAB — TSH: TSH: 1.53 u[IU]/mL (ref 0.35–5.50)

## 2023-08-17 LAB — CBC
HCT: 42.6 % (ref 39.0–52.0)
Hemoglobin: 13.3 g/dL (ref 13.0–17.0)
MCHC: 31.3 g/dL (ref 30.0–36.0)
MCV: 84.3 fL (ref 78.0–100.0)
Platelets: 238 10*3/uL (ref 150.0–400.0)
RBC: 5.05 Mil/uL (ref 4.22–5.81)
RDW: 15.9 % — ABNORMAL HIGH (ref 11.5–15.5)
WBC: 4 10*3/uL (ref 4.0–10.5)

## 2023-08-17 LAB — PSA: PSA: 0.74 ng/mL (ref 0.10–4.00)

## 2023-08-17 NOTE — Assessment & Plan Note (Signed)
Discussed age-appropriate immunizations and screening exams.  Did review patient's personal, surgical, social, family histories.  Patient up-to-date on all age-appropriate vaccinations he would like.  Patient was referred to GI for CRC screening.  Did draw PSA for prostate cancer screening today.  Patient was given information at discharge about preventative healthcare maintenance with anticipatory guidance

## 2023-08-17 NOTE — Patient Instructions (Signed)
Nice to see you today I will be in touch with the labs once I have them Follow up with me in 1 year, sooner if you need me   

## 2023-08-17 NOTE — Assessment & Plan Note (Signed)
Patient states he found out he had PTSD.  Was doing therapy but noticed as intermittently as needed.  No medications at this juncture.  Patient did not pursue ECT

## 2023-08-17 NOTE — Assessment & Plan Note (Signed)
Incidental finding on exam no appreciable hernia.

## 2023-08-17 NOTE — Progress Notes (Signed)
Established Patient Office Visit  Subjective   Patient ID: Willie Farmer., male    DOB: 1976-03-06  Age: 47 y.o. MRN: [id number]  Chief Complaint  Patient presents with   Transitions Of Care    HPI  Transfer of care: last seen by Mayra Reel on 04/10/2020, he is considered a new patient.  for complete physical and follow up of chronic conditions.   Seasonal allergies: he was tested and no allergies. He iwll [place] atrovent when he gets cold    Mood Disorder: states that he has PTSD. States that he was doing therapy. States that he will do therapy intermittent State that he was going ECT. Never did    Immunizations: -Tetanus: Completed in within 10 years -Influenza: 08/08/2022 -Shingles: Too young -Pneumonia: Too young  Diet: Fair diet. 2 meals a day. With no snacking. Water  Exercise: No regular exercise. States that he will run 2 miles a day mond-Thursday. 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 Geels rested some times.  Does not snore     Review of Systems  Constitutional:  Negative for chills and fever.  Respiratory:  Negative for shortness of breath.   Cardiovascular:  Negative for chest pain and leg swelling.  Gastrointestinal:  Negative for abdominal pain, blood in stool, constipation, diarrhea, nausea and vomiting.       BM every other day   Genitourinary:  Negative for dysuria and hematuria.  Neurological:  Negative for tingling and headaches.  Psychiatric/Behavioral:  Negative for hallucinations and suicidal ideas.       Objective:     BP 130/88   Pulse (!) 56   Temp 98.4 F (36.9 C) (Oral)   Ht 6' (1.829 m)   Wt 171 lb 6.4 oz (77.7 kg)   SpO2 99%   BMI 23.25 kg/m    Physical Exam Vitals and nursing note  reviewed.  Constitutional:      Appearance: Normal appearance.  HENT:     Right Ear: Tympanic membrane, ear canal and external ear normal.     Left Ear: Tympanic membrane, ear canal and external ear normal.     Mouth/Throat:     Mouth: Mucous membranes are moist.     Pharynx: Oropharynx is clear.  Eyes:     Extraocular Movements: Extraocular movements intact.     Pupils: Pupils are equal, round, and reactive to light.  Cardiovascular:     Rate and Rhythm: Normal rate and regular rhythm.     Pulses: Normal pulses.     Heart sounds: Normal heart sounds.  Pulmonary:     Effort: Pulmonary effort is normal.     Breath sounds: Normal breath sounds.  Abdominal:     General: Bowel sounds are normal. There is no distension.     Palpations: There is no mass.     Tenderness: There is no abdominal tenderness.     Hernia: No hernia is present.  Musculoskeletal:     Right lower leg: No edema.     Left lower leg: No edema.  Lymphadenopathy:     Cervical: No cervical adenopathy.  Skin:    General: Skin is warm.  Neurological:     General: No focal deficit present.  Mental Status: He is alert.     Deep Tendon Reflexes:     Reflex Scores:      Bicep reflexes are 2+ on the right side and 2+ on the left side.      Patellar reflexes are 2+ on the right side and 2+ on the left side.    Comments: Bilateral upper and lower extremity strength 5/5  Psychiatric:        Mood and Affect: Mood normal.        Behavior: Behavior normal.        Thought Content: Thought content normal.        Judgment: Judgment normal.      No results found for any visits on 08/17/23.    The ASCVD Risk score (Arnett DK, et al., 2019) failed to calculate for the following reasons:   Cannot find a previous HDL lab   Cannot find a previous total cholesterol lab    Assessment & Plan:   Problem List Items Addressed This Visit       Musculoskeletal and Integument   Rectus diastasis    Incidental finding on  exam no appreciable hernia.        Other   Preventative health care - Primary    Discussed age-appropriate immunizations and screening exams.  Did review patient's personal, surgical, social, family histories.  Patient up-to-date on all age-appropriate vaccinations he would like.  Patient was referred to GI for CRC screening.  Did draw PSA for prostate cancer screening today.  Patient was given information at discharge about preventative healthcare maintenance with anticipatory guidance      Relevant Orders   CBC   Comprehensive metabolic panel   TSH   Mood disorder [facility])    Patient states he found out he had PTSD.  Was doing therapy but noticed as intermittently as needed.  No medications at this juncture.  Patient did not pursue ECT      Other Visit Diagnoses     Screening for colon cancer       Relevant Orders   Ambulatory referral to Gastroenterology   Screening for prostate cancer       Relevant Orders   PSA       Return in about 1 year (around 08/16/2024) for CPE and Labs.    Audria Nine, NP

## 2023-09-10 ENCOUNTER — Encounter: Payer: Self-pay | Admitting: *Deleted

## 2023-09-10 ENCOUNTER — Ambulatory Visit
Admission: EM | Admit: 2023-09-10 | Discharge: 2023-09-10 | Disposition: A | Discharge status: Home / Self Care | Payer: BC Managed Care – PPO | Attending: Emergency Medicine | Admitting: Emergency Medicine

## 2023-09-10 DIAGNOSIS — J069 Acute upper respiratory infection, unspecified: Secondary | ICD-10-CM

## 2023-09-10 MED ORDER — BENZONATATE 100 MG PO CAPS: 100.0000 mg | ORAL_CAPSULE | Freq: Three times a day (TID) | ORAL | 0 refills | Status: DC

## 2023-09-10 MED ORDER — AZITHROMYCIN 250 MG PO TABS: 250.0000 mg | ORAL_TABLET | Freq: Every day | ORAL | 0 refills | Status: DC

## 2023-09-10 NOTE — ED Triage Notes (Signed)
Patient states cough intermittent subjective fever, ear fullness for about 1 week.  Taking OTC Nyquil

## 2023-09-10 NOTE — ED Provider Notes (Signed)
Willie Farmer    CSN: [id number] Arrival date & time: 09/10/23  1102      History   Chief Complaint Chief Complaint  Patient presents with   Cough   Fever    HPI Willie SAELEE Sr. is a 47 y.o. male.   Patient presents for evaluation of persisting and worsening upper respiratory symptoms for 8 days.  Initially began as a " tickle to the throat" persisted for 6 days.  Over the last 2 days he has begun to experience subjective fever, watery diarrhea increased and persistent nasal congestion, nonproductive cough and right-sided ear fullness.  Known sick contacts in household who had similar symptoms have already resolved.  Has been using over-the-counter multisymptom cold and night medicine which has provided some relief.  Tolerating food and liquids.  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   Chronic rhinitis 08/31/2021   Other adverse food reactions, not elsewhere classified, subsequent encounter 08/31/2021   Oral aphthous ulcer 02/26/2020   GERD (gastroesophageal reflux disease) 08/20/2018   Mood disorder (HCC) 08/20/2018   Seasonal allergic rhinitis 03/02/2017   Preventative health care 07/13/2016    Past Surgical History:  Procedure Laterality Date   ESOPHAGOGASTRODUODENOSCOPY  08/01/2012   URETEROSCOPY WITH HOLMIUM LASER LITHOTRIPSY Right 10/07/2019   Procedure: URETEROSCOPY / STENT PLACEMENT STONE BASKET RETRIVAL;  Surgeon: Ihor Gully, MD;  Location: [facility];  Service: Urology;  Laterality: Right;   WISDOM TOOTH EXTRACTION         Home Medications    Prior to Admission medications   Medication Sig Start Date End Date Taking? Authorizing Provider  azithromycin (ZITHROMAX) 250 MG tablet Take 1 tablet (250 mg total) by  mouth daily. Take first 2 tablets together, then 1 every day until finished. 09/10/23  Yes Lisandra Mathisen R, NP  benzonatate (TESSALON) 100 MG capsule Take 1 capsule (100 mg total) by mouth every 8 (eight) hours. 09/10/23  Yes Salli Quarry R, NP  ipratropium (ATROVENT) 0.06 % nasal spray  11/29/21   [provider]    Family History Family History  Problem Relation Age of Onset   Prostate cancer Father 29   Prostate cancer Paternal Uncle    Prostate cancer Paternal Grandfather    Cancer Paternal Grandfather    Colon cancer Neg Hx    Allergic rhinitis Neg Hx    Asthma Neg Hx    Eczema Neg Hx    Urticaria Neg Hx     Social History Social History   Tobacco Use   Smoking status: Never   Smokeless tobacco: Never  Vaping Use   Vaping status: Never Used  Substance Use Topics   Alcohol use: Yes    Comment: occasional   Drug use: Never     Allergies   Latex and Covid-19 (mrna bivalent) vaccine (pfizer) [covid-19 (mrna) vaccine]   Review of Systems Review of Systems   Physical Exam Triage Vital Signs ED Triage Vitals  Encounter Vitals Group     BP 09/10/23 1114 134/87     Systolic BP Percentile --      Diastolic BP Percentile --      Pulse Rate 09/10/23 1114 70     Resp 09/10/23 1114 16  Temp 09/10/23 1114 98.6 F (37 C)     Temp Source 09/10/23 1114 Oral     SpO2 09/10/23 1114 95 %     Weight 09/10/23 1112 170 lb (77.1 kg)     Height 09/10/23 1112 6\' 1"  (1.854 m)     Head Circumference --      Peak Flow --      Pain Score 09/10/23 1111 0     Pain Loc --      Pain Education --      Exclude from Growth Chart --    No data found.  Updated Vital Signs BP 134/87 (BP Location: Left Arm)   Pulse 70   Temp 98.6 F (37 C) (Oral)   Resp 16   Ht 6\' 1"  (1.854 m)   Wt 170 lb (77.1 kg)   SpO2 95%   BMI 22.43 kg/m   Visual Acuity Right Eye Distance:   Left Eye Distance:   Bilateral Distance:    Right Eye Near:   Left Eye Near:    Bilateral  Near:     Physical Exam Constitutional:      Appearance: Normal appearance.  HENT:     Head: Normocephalic.     Right Ear: Ear canal and external ear normal. A middle ear effusion is present.     Left Ear: Tympanic membrane, ear canal and external ear normal.     Nose: Congestion and rhinorrhea present.     Mouth/Throat:     Mouth: Mucous membranes are moist.     Pharynx: Oropharynx is clear. No oropharyngeal exudate or posterior oropharyngeal erythema.  Eyes:     Extraocular Movements: Extraocular movements intact.  Cardiovascular:     Rate and Rhythm: Normal rate and regular rhythm.     Pulses: Normal pulses.     Heart sounds: Normal heart sounds.  Pulmonary:     Effort: Pulmonary effort is normal.     Breath sounds: Normal breath sounds.  Musculoskeletal:     Cervical back: Normal range of motion and neck supple.  Skin:    General: Skin is warm and dry.  Neurological:     Mental Status: He is alert and oriented to person, place, and time. Mental status is at baseline.      UC Treatments / Results  Labs (all labs ordered are listed, but only abnormal results are displayed) Labs Reviewed - No data to display  EKG   Radiology No results found.  Procedures Procedures (including critical care time)  Medications Ordered in UC Medications - No data to display  Initial Impression / Assessment and Plan / UC Course  I have reviewed the triage vital signs and the nursing notes.  Pertinent labs & imaging results that were available during my care of the patient were reviewed by me and considered in my medical decision making (see chart for details).  Acute URI  Patient is in no signs of distress nor toxic appearing.  Vital signs are stable.  Low suspicion for pneumonia, pneumothorax or bronchitis and therefore will defer imaging.  Viral testing deferred due to timeline of illness.  Symptoms initially began 7 days ago, has begun to experience new symptoms and they are  progressively worsening will provide bacterial coverage, prescribed azithromycin as well as Tessalon for management of coughing.May use additional over-the-counter medications as needed for supportive care.  May follow-up with urgent care as needed if symptoms persist or worsen.  Final Clinical Impressions(s) / UC Diagnoses  Final diagnoses:  Acute URI     Discharge Instructions      Begin use of azithromycin which is an antibiotic which will provide coverage for bacteria which is most likely causing symptoms to to persist  You may use Tessalon pill every 8 hours as needed to help reduce coughing  You may continue use of multisymptom day and night medications, may also attempt any of the following below to keep you comfortable  You can take Tylenol and/or Ibuprofen as needed for fever reduction and pain relief.   For cough: honey 1/2 to 1 teaspoon (you can dilute the honey in water or another fluid).  You can also use guaifenesin and dextromethorphan for cough. You can use a humidifier for chest congestion and cough.  If you don't have a humidifier, you can sit in the bathroom with the hot shower running.      For sore throat: try warm salt water gargles, cepacol lozenges, throat spray, warm tea or water with lemon/honey, popsicles or ice, or OTC cold relief medicine for throat discomfort.   For congestion: take a daily anti-histamine like Zyrtec, Claritin, and a oral decongestant, such as pseudoephedrine.  You can also use Flonase 1-2 sprays in each nostril daily.   It is important to stay hydrated: drink plenty of fluids (water, gatorade/powerade/pedialyte, juices, or teas) to keep your throat moisturized and help further relieve irritation/discomfort.    ED Prescriptions     Medication Sig Dispense Auth. Provider   azithromycin (ZITHROMAX) 250 MG tablet Take 1 tablet (250 mg total) by mouth daily. Take first 2 tablets together, then 1 every day until finished. 6 tablet Yzabella Crunk,  Deshunda Thackston R, NP   benzonatate (TESSALON) 100 MG capsule Take 1 capsule (100 mg total) by mouth every 8 (eight) hours. 21 capsule Noelene Gang, Elita Boone, NP      PDMP not reviewed this encounter.   Valinda Hoar, NP 09/10/23 1142

## 2023-09-10 NOTE — Discharge Instructions (Signed)
Begin use of azithromycin which is an antibiotic which will provide coverage for bacteria which is most likely causing symptoms to to persist  You may use Tessalon pill every 8 hours as needed to help reduce coughing  You may continue use of multisymptom day and night medications, may also attempt any of the following below to keep you comfortable  You can take Tylenol and/or Ibuprofen as needed for fever reduction and pain relief.   For cough: honey 1/2 to 1 teaspoon (you can dilute the honey in water or another fluid).  You can also use guaifenesin and dextromethorphan for cough. You can use a humidifier for chest congestion and cough.  If you don't have a humidifier, you can sit in the bathroom with the hot shower running.      For sore throat: try warm salt water gargles, cepacol lozenges, throat spray, warm tea or water with lemon/honey, popsicles or ice, or OTC cold relief medicine for throat discomfort.   For congestion: take a daily anti-histamine like Zyrtec, Claritin, and a oral decongestant, such as pseudoephedrine.  You can also use Flonase 1-2 sprays in each nostril daily.   It is important to stay hydrated: drink plenty of fluids (water, gatorade/powerade/pedialyte, juices, or teas) to keep your throat moisturized and help further relieve irritation/discomfort.

## 2023-10-05 ENCOUNTER — Encounter: Payer: Self-pay | Admitting: Gastroenterology

## 2023-11-09 ENCOUNTER — Ambulatory Visit: Payer: Self-pay | Admitting: Nurse Practitioner

## 2023-11-09 ENCOUNTER — Ambulatory Visit: Payer: BC Managed Care – PPO | Admitting: Family Medicine

## 2023-11-09 DIAGNOSIS — H8112 Benign paroxysmal vertigo, left ear: Secondary | ICD-10-CM | POA: Diagnosis not present

## 2023-11-09 NOTE — Telephone Encounter (Signed)
Copied from CRM [id number]. Topic: Clinical - Pink Word Triage >> Nov 09, 2023 10:41 AM Isabell A wrote: Reason for Triage: Dizziness, blood pressure, fall    Chief Complaint: Dizziness Symptoms: dizziness, hot flash, headache Frequency: started this morning Pertinent Negatives: Patient denies earach, chest pain, nausea and vomiting Disposition: [] ED /[] Urgent Care (no appt availability in office) / [x] Appointment(In office/virtual)/ []  [place] [facility]/ [] Home Care/ [] Refused Recommended Disposition /[] [facility] [facility]/ []  Follow-up with PCP Additional Notes: Patient reports dizziness that started this morning when waking up. Sts that he had an episode where he got up to do something and felt the room spinning and fell. Wife is currently with patient, both state that he is now able to walk without falling, but still a little unsteady. Pt also reports head pressure in back of head and hot flashes. Appt scheduled at [facility] today at 3:20. RN provided care advice to patient and wife, both verbalize understanding.       Reason for Disposition  [1] MODERATE dizziness (e.g., vertigo; feels very unsteady, interferes with normal activities) AND [2] has NOT been evaluated by doctor (or NP/PA) for this  Answer Assessment - Initial Assessment Questions 1. DESCRIPTION: "Describe your dizziness."     Rocking left and right, no equilibrium  2. VERTIGO: "Do you feel like either you or the room is spinning or tilting?"      Head feels tight, room spinning  3. LIGHTHEADED: "Do you feel lightheaded?" (e.g., somewhat faint, woozy, weak upon standing)     Somewhat faint  4. SEVERITY: "How bad is it?"  "Can you walk?"   - MILD: Feels slightly dizzy and unsteady, but is walking normally.   - MODERATE: Feels unsteady when walking, but not falling; interferes with normal activities (e.g., school, work).   - SEVERE: Unable to walk without falling, or requires assistance to walk without  falling.     Severe- needs assistance to walk  5. ONSET:  "When did the dizziness begin?"     Felt dizzy this morning, but had a dizzy spell and fell this morning., 6. AGGRAVATING FACTORS: "Does anything make it worse?" (e.g., standing, change in head position)     Worsened by standing  7. CAUSE: "What do you think is causing the dizziness?"     Unsure of what is causing the dizziness  8. 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 OTHER SYMPTOMS: "Do you have any other symptoms?" (e.g., headache, weakness, numbness, vomiting, earache)     Hot flashes and head pressure  Protocols used: Dizziness - Vertigo-A-AH

## 2023-11-09 NOTE — Telephone Encounter (Signed)
Agree with immediate evaluation

## 2023-11-09 NOTE — Telephone Encounter (Signed)
Pt already triaged by contact center and has appt with Dr Patsy Lager 11/09/23 at 3:20. Pt said he was at home and had more pressure in back of head and got extremely dizzy and fell to floor.symptoms came on suddenly Pt said has been 15 yrs since had vertigo episode; pt said at home he got really hot and had trouble walking due to room spinning. Pt said pressure from back of head is moving up to top of head like in waves. Pt has been very dizzy on and off since came in front lobby. Placed pt in w/c so would not fall while room spinning. Pt has laughed three times for no apparent reason and then pt said he does that.?  Twice pt  had to have long pauses while trying to express what he wanted to say. Pt denies, CP,SOB, vision changes or H/A. Pt said he does not want to go to ED but pt will go to [facility] now. Pt wife will drive him to UC now; UC & ED precautions were given and pt and pts wife voiced understanding. BP 138/90 lt arm sitting reg cuff T 97.9 P 55 pulse ox 97%. Cancelled appt with Dr Patsy Lager. Sending note to Audria Nine NP, [place] [facility] and I have spoken with [facility] CMA.

## 2023-11-17 ENCOUNTER — Ambulatory Visit (AMBULATORY_SURGERY_CENTER): Payer: BC Managed Care – PPO | Admitting: *Deleted

## 2023-11-17 VITALS — Ht 74.0 in | Wt 170.0 lb

## 2023-11-17 DIAGNOSIS — Z1211 Encounter for screening for malignant neoplasm of colon: Secondary | ICD-10-CM

## 2023-11-17 MED ORDER — NA SULFATE-K SULFATE-MG SULF 17.5-3.13-1.6 GM/177ML PO SOLN: 1.0000 | Freq: Once | ORAL | 0 refills | Status: AC

## 2023-11-17 NOTE — Progress Notes (Signed)
 Pt's name and DOB verified at the beginning of the pre-visit wit 2 identifiers  Pt denies any difficulty with ambulating,sitting, laying down or rolling side to side  Pt has no issues with ambulation   Pt has no issues moving head neck or swallowing  No egg or soy allergy known to patient   No issues known to pt with past sedation with any surgeries or procedures  Patient denies ever being intubated  No FH of Malignant Hyperthermia  Pt is not on diet pills or shots  Pt is not on home 02   Pt is not on blood thinners   Pt denies issues with constipation   Pt is not on dialysis  Pt states he has low resting heart rate due to lifestyle  Pt denies any upcoming cardiac testing  Pt encouraged to use to use Singlecare or Goodrx to reduce cost   Patient's chart reviewed by Norleen Schillings CNRA prior to pre-visit and patient appropriate for the [facility].  Pre-visit completed and red dot placed by patient's name on their procedure day (on provider's schedule).  .  Visit by phone  Pt states weight is 170 lb  Instructed pt why it is important to and  to call if they have any changes in health or new medications. Directed them to the # given and on instructions.     Instructions reviewed. Pt given both [facility] main # and MD on call # prior to instructions.  Pt states understanding. Instructed to review again prior to procedure. Pt states they will.   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

## 2023-11-23 ENCOUNTER — Encounter: Payer: Self-pay | Admitting: Family Medicine

## 2023-11-23 ENCOUNTER — Ambulatory Visit: Payer: BC Managed Care – PPO | Admitting: Family Medicine

## 2023-11-23 VITALS — BP 104/86 | HR 58 | Temp 98.4°F | Ht 72.0 in | Wt 175.0 lb

## 2023-11-23 DIAGNOSIS — J069 Acute upper respiratory infection, unspecified: Secondary | ICD-10-CM

## 2023-11-23 DIAGNOSIS — R0989 Other specified symptoms and signs involving the circulatory and respiratory systems: Secondary | ICD-10-CM

## 2023-11-23 DIAGNOSIS — R42 Dizziness and giddiness: Secondary | ICD-10-CM

## 2023-11-23 LAB — POC INFLUENZA A&B (BINAX/QUICKVUE)
Influenza A, POC: NEGATIVE
Influenza B, POC: NEGATIVE

## 2023-11-23 LAB — POC COVID19 BINAXNOW: SARS Coronavirus 2 Ag: NEGATIVE

## 2023-11-23 NOTE — Patient Instructions (Signed)
 Dramamine - get the type without meclizine

## 2023-11-23 NOTE — Progress Notes (Signed)
 Willie Pousson T. Aalliyah Kilker, MD, CAQ Sports Medicine [facility] at [facility] [address] [place], [address]  Phone: [phone]  FAX: [phone]  Square Jowett. - 48 y.o. male  MRN [id number]  Date of Birth: 1976/06/22  Date: 11/23/2023  PCP: Wendee Lynwood HERO, NP  Referral: Wendee Lynwood HERO, NP  Chief Complaint  Patient presents with   Sores in Right Ear   Nasal Congestion   Sore Throat   Hot Flashes   Subjective:   Willie NOE Sr. is a 48 y.o. very pleasant male patient with Body mass index is 23.73 kg/m. who presents with the following:  The patient presents for an acute illness assessment  [facility]ent to [facility], and he was diagnosed with vertigo and given some meclizine. He is worried that meclizine had some type of similar composition as the [employer] COVID vaccination.  Sores in his R ear right now -he did express some p.o. from a lesion in his right ear, now it is doing better.  Hot flahes Runny nose  Sore throat Makes some mucous after eating, but this is longstanding  No nausea, vomiting, diarrhea  Got cloudy from meclizine WOrried about Covid caccination - angular chelitis, arm numbness  Temp yesterday Sweating  Nonsmoker      Review of Systems is noted in the HPI, as appropriate  Objective:   BP 104/86 (BP Location: Left Arm, Patient Position: Sitting, Cuff Size: Large)   Pulse (!) 58   Temp 98.4 F (36.9 C) (Temporal)   Ht 6' (1.829 m)   Wt 175 lb (79.4 kg)   SpO2 99%   BMI 23.73 kg/m    Gen: WDWN, NAD. Globally Non-toxic HEENT: Throat clear, w/o exudate, R TM clear, L TM - good landmarks, No fluid present. rhinnorhea.  MMM Frontal sinuses: NT Max sinuses: NT NECK: Anterior cervical  LAD is absent CV: RRR, No M/G/R, cap refill <2 sec PULM: Breathing comfortably in no respiratory distress. no wheezing, crackles, rhonchi   Laboratory and Imaging Data: Results for orders placed or performed in visit on  11/23/23  POC Influenza A&B (Binax test)   Collection Time: 11/23/23 12:40 PM  Result Value Ref Range   Influenza A, POC Negative Negative   Influenza B, POC Negative Negative  POC COVID-19   Collection Time: 11/23/23 12:41 PM  Result Value Ref Range   SARS Coronavirus 2 Ag Negative Negative     Assessment and Plan:     ICD-10-CM   1. Viral URI  J06.9     2. Runny nose  R09.89 POC Influenza A&B (Binax test)    POC COVID-19    3. Vertigo  R42      Anticipate viral labyrinthitis versus BPPV.  Continue with supportive care.  I would avoid meclizine given his concerns, but he can use traditional Dramamine.  Sedation cautions.  I suspect that his runny nose and other systemic symptoms are from a viral respiratory infection.  No COVID or flu.  Medication Management during today's office visit: No orders of the defined types were placed in this encounter.  There are no discontinued medications.  Orders placed today for conditions managed today: Orders Placed This Encounter  Procedures   POC Influenza A&B (Binax test)   POC COVID-19    Disposition: No follow-ups on file.  Dr[employer] One speech-to-text software was used for transcription in this dictation.  Possible transcriptional errors can occur using Animal nutritionist.   Signed,  Willie Schmit T. Charleton Deyoung, MD   Outpatient Encounter Medications as of 11/23/2023  Medication Sig   ipratropium (ATROVENT ) 0.06 % nasal spray    No facility-administered encounter medications on file as of 11/23/2023.

## 2023-12-05 ENCOUNTER — Encounter: Payer: Self-pay | Admitting: Gastroenterology

## 2023-12-05 ENCOUNTER — Encounter: Payer: Self-pay | Admitting: Certified Registered Nurse Anesthetist

## 2023-12-06 ENCOUNTER — Telehealth: Payer: Self-pay | Admitting: Gastroenterology

## 2023-12-06 NOTE — Telephone Encounter (Signed)
Returned pts call.  Advised him that he is ok to proceed with procedure.  Encouraged him to push fluids.

## 2023-12-06 NOTE — Telephone Encounter (Signed)
Inbound call from patient stating he ate a meal at 11:00 am today. States he did not eat anything else after. Patient is requesting a call to discuss how to proceed with procedure scheduled for tomorrow 1/23. Please advise, thank you.

## 2023-12-07 ENCOUNTER — Encounter: Payer: Self-pay | Admitting: Gastroenterology

## 2023-12-07 ENCOUNTER — Ambulatory Visit (AMBULATORY_SURGERY_CENTER): Payer: BC Managed Care – PPO | Admitting: Gastroenterology

## 2023-12-07 VITALS — BP 125/84 | HR 55 | Temp 98.8°F | Resp 18 | Ht 74.0 in | Wt 170.0 lb

## 2023-12-07 DIAGNOSIS — Z1211 Encounter for screening for malignant neoplasm of colon: Secondary | ICD-10-CM

## 2023-12-07 DIAGNOSIS — K5730 Diverticulosis of large intestine without perforation or abscess without bleeding: Secondary | ICD-10-CM

## 2023-12-07 MED ORDER — SODIUM CHLORIDE 0.9 % IV SOLN: 500.0000 mL | Freq: Once | INTRAVENOUS | Status: DC

## 2023-12-07 NOTE — Progress Notes (Signed)
GASTROENTEROLOGY PROCEDURE H&P NOTE   Primary Care Physician: Eden Emms, NP    Reason for Procedure:  Colon Cancer screening  Plan:    Colonoscopy  Patient is appropriate for endoscopic procedure(s) in the ambulatory ([facility]) setting.  The nature of the procedure, as well as the risks, benefits, and alternatives were carefully and thoroughly reviewed with the patient. Ample time for discussion and questions allowed. The patient understood, was satisfied, and agreed to proceed.     HPI: Willie BOTBYL Sr. is a 48 y.o. male who presents for colonoscopy for routine Colon Cancer screening.  No active GI symptoms.  No known family history of colon cancer or related malignancy.  Patient is otherwise without complaints or active issues today.  Past Medical History:  Diagnosis Date   History of anal fissures 2015   History of viral meningitis 1995   Right ureteral stone    Seasonal allergies    Wears glasses     Past Surgical History:  Procedure Laterality Date   ESOPHAGOGASTRODUODENOSCOPY  08/01/2012   URETEROSCOPY WITH HOLMIUM LASER LITHOTRIPSY Right 10/07/2019   Procedure: URETEROSCOPY / STENT PLACEMENT STONE BASKET RETRIVAL;  Surgeon: Ihor Gully, MD;  Location: [facility];  Service: Urology;  Laterality: Right;   WISDOM TOOTH EXTRACTION      Prior to Admission medications   Medication Sig Start Date End Date Taking? Authorizing Provider  ipratropium (ATROVENT) 0.06 % nasal spray  11/29/21  Yes [provider]    Current Outpatient Medications  Medication Sig Dispense Refill   ipratropium (ATROVENT) 0.06 % nasal spray      Current Facility-Administered Medications  Medication Dose Route Frequency Provider Last Rate Last Admin   0.9 %  sodium chloride infusion  500 mL Intravenous Once Brenlee Koskela V, DO        Allergies as of 12/07/2023 - Review Complete 12/07/2023  Allergen Reaction Noted   Covid-19 (mrna bivalent) vaccine (pfizer)  [covid-19 (mrna) vaccine] Other (See Comments) 08/17/2023   Meclizine Hives 11/23/2023   Latex Rash 10/03/2019    Family History  Problem Relation Age of Onset   Prostate cancer Father 66   Prostate cancer Paternal Uncle    Prostate cancer Paternal Grandfather    Cancer Paternal Grandfather    Colon cancer Neg Hx    Allergic rhinitis Neg Hx    Asthma Neg Hx    Eczema Neg Hx    Urticaria Neg Hx    Colon polyps Neg Hx    Esophageal cancer Neg Hx    Stomach cancer Neg Hx    Rectal cancer Neg Hx     Social History   Socioeconomic History   Marital status: Married    Spouse name: Careers adviser   Number of children: 1   Years of education: Not on file   Highest education level: Not on file  Occupational History   Occupation: Barista: [employer]  Tobacco Use   Smoking status: Never   Smokeless tobacco: Never  Vaping Use   Vaping status: Never Used  Substance and Sexual Activity   Alcohol use: Yes    Comment: occasional   Drug use: Never   Sexual activity: Yes  Other Topics Concern   Not on file  Social History Narrative   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)   Social Drivers of Health   Financial Resource Strain: Not on file  Food Insecurity: Not  on file  Transportation Needs: Not on file  Physical Activity: Not on file  Stress: Not on file  Social Connections: Not on file  Intimate Partner Violence: Not on file    Physical Exam: Vital signs in last 24 hours: @BP  (!) 147/96   Pulse (!) 54   Temp 98.8 F (37.1 C)   Resp 11   Ht 6\' 2"  (1.88 m)   Wt 170 lb (77.1 kg)   SpO2 100%   BMI 21.83 kg/m  GEN: NAD EYE: Sclerae anicteric ENT: MMM CV: Non-tachycardic Pulm: CTA b/l GI: Soft, NT/ND NEURO:  Alert & Oriented x 3   Doristine Locks, DO [facility]   12/07/2023 9:17 AM

## 2023-12-07 NOTE — Op Note (Signed)
[facility] Patient Name: Willie Farmer Procedure Date: 12/07/2023 9:11 AM MRN: [id number] Endoscopist: Doristine Locks , MD, [id number] Age: 48 Referring MD:  Date of Birth: 1976-08-09 Gender: Male Account #: [id number] Procedure:                Colonoscopy Indications:              Screening for colorectal malignant neoplasm, This                            is the patient's first colonoscopy Medicines:                Monitored Anesthesia Care Procedure:                Pre-Anesthesia Assessment:                           - Prior to the procedure, a History and Physical                            was performed, and patient medications and                            allergies were reviewed. The patient's tolerance of                            previous anesthesia was also reviewed. The risks                            and benefits of the procedure and the sedation                            options and risks were discussed with the patient.                            All questions were answered, and informed consent                            was obtained. Prior Anticoagulants: The patient has                            taken no anticoagulant or antiplatelet agents. ASA                            Grade Assessment: II - A patient with mild systemic                            disease. After reviewing the risks and benefits,                            the patient was deemed in satisfactory condition to                            undergo the procedure.  After obtaining informed consent, the colonoscope                            was passed under direct vision. Throughout the                            procedure, the patient's blood pressure, pulse, and                            oxygen saturations were monitored continuously. The                            Olympus Scope [id number] was introduced through the                            anus and advanced to  the the terminal ileum. The                            colonoscopy was performed without difficulty. The                            patient tolerated the procedure well. The quality                            of the bowel preparation was good. The terminal                            ileum, ileocecal valve, appendiceal orifice, and                            rectum were photographed. Scope In: 9:22:45 AM Scope Out: 9:34:28 AM Scope Withdrawal Time: 0 hours 9 minutes 13 seconds  Total Procedure Duration: 0 hours 11 minutes 43 seconds  Findings:                 The perianal and digital rectal examinations were                            normal.                           Multiple large-mouthed and small-mouthed                            diverticula were found in the entire colon.                           The exam was otherwise normal throughout the                            remainder of the colon.                           The retroflexed view of the distal rectum and anal  verge was normal and showed no anal or rectal                            abnormalities.                           The terminal ileum appeared normal. Complications:            No immediate complications. Estimated Blood Loss:     Estimated blood loss: none. Impression:               - Diverticulosis in the entire examined colon.                           - The distal rectum and anal verge are normal on                            retroflexion view.                           - The examined portion of the ileum was normal.                           - No specimens collected. Recommendation:           - Patient has a contact number available for                            emergencies. The signs and symptoms of potential                            delayed complications were discussed with the                            patient. Return to normal activities tomorrow.                             Written discharge instructions were provided to the                            patient.                           - Resume previous diet.                           - Continue present medications.                           - Repeat colonoscopy in 10 years for screening                            purposes.                           - Return to GI office PRN. Doristine Locks, MD 12/07/2023 9:39:15 AM

## 2023-12-07 NOTE — Patient Instructions (Signed)
Resume previous diet.  Continue present medications.     YOU HAD AN ENDOSCOPIC PROCEDURE TODAY AT THE [facility]:   Refer to the procedure report that was given to you for any specific questions about what was found during the examination.  If the procedure report does not answer your questions, please call your gastroenterologist to clarify.  If you requested that your care partner not be given the details of your procedure findings, then the procedure report has been included in a sealed envelope for you to review at your convenience later.  YOU SHOULD EXPECT: Some feelings of bloating in the abdomen. Passage of more gas than usual.  Walking can help get rid of the air that was put into your GI tract during the procedure and reduce the bloating. If you had a lower endoscopy (such as a colonoscopy or flexible sigmoidoscopy) you may notice spotting of blood in your stool or on the toilet paper. If you underwent a bowel prep for your procedure, you may not have a normal bowel movement for a few days.  Please Note:  You might notice some irritation and congestion in your nose or some drainage.  This is from the oxygen used during your procedure.  There is no need for concern and it should clear up in a day or so.  SYMPTOMS TO REPORT IMMEDIATELY:  Following lower endoscopy (colonoscopy or flexible sigmoidoscopy):  Excessive amounts of blood in the stool  Significant tenderness or worsening of abdominal pains  Swelling of the abdomen that is new, acute  Fever of 100F or higher   For urgent or emergent issues, a gastroenterologist can be reached at any hour by calling [phone]. Do not use MyChart messaging for urgent concerns.    DIET:  We do recommend a small meal at first, but then you may proceed to your regular diet.  Drink plenty of fluids but you should avoid alcoholic beverages for 24 hours.  ACTIVITY:  You should plan to take it easy for the rest of today and you  should NOT DRIVE or use heavy machinery until tomorrow (because of the sedation medicines used during the test).    FOLLOW UP: Our staff will call the number listed on your records the next business day following your procedure.  We will call around 7:15- 8:00 am to check on you and address any questions or concerns that you may have regarding the information given to you following your procedure. If we do not reach you, we will leave a message.     If any biopsies were taken you will be contacted by phone or by letter within the next 1-3 weeks.  Please call us at [phone] if you have not heard about the biopsies in 3 weeks.    SIGNATURES/CONFIDENTIALITY: You and/or your care partner have signed paperwork which will be entered into your electronic medical record.  These signatures attest to the fact that that the information above on your After Visit Summary has been reviewed and is understood.  Full responsibility of the confidentiality of this discharge information lies with you and/or your care-partner.

## 2023-12-07 NOTE — Progress Notes (Signed)
Report given to PACU, vss 

## 2023-12-08 ENCOUNTER — Telehealth: Payer: Self-pay

## 2023-12-08 NOTE — Telephone Encounter (Signed)
  Follow up Call-     12/07/2023    8:15 AM  Call back number  Post procedure Call Back phone  # [phone]  Permission to leave phone message Yes     Patient questions:  Do you have a fever, pain , or abdominal swelling? No. Pain Score  0 *  Have you tolerated food without any problems? Yes.    Have you been able to return to your normal activities? Yes.    Do you have any questions about your discharge instructions: Diet   No. Medications  No. Follow up visit  No.  Do you have questions or concerns about your Care? No.  Actions: * If pain score is 4 or above: No action needed, pain <4.

## 2023-12-20 ENCOUNTER — Ambulatory Visit
Admission: EM | Admit: 2023-12-20 | Discharge: 2023-12-20 | Disposition: A | Discharge status: Home / Self Care | Payer: BC Managed Care – PPO | Attending: Emergency Medicine | Admitting: Emergency Medicine

## 2023-12-20 DIAGNOSIS — J101 Influenza due to other identified influenza virus with other respiratory manifestations: Secondary | ICD-10-CM

## 2023-12-20 DIAGNOSIS — H6502 Acute serous otitis media, left ear: Secondary | ICD-10-CM

## 2023-12-20 LAB — POC COVID19/FLU A&B COMBO
Covid Antigen, POC: NEGATIVE
Influenza A Antigen, POC: POSITIVE — AB
Influenza B Antigen, POC: NEGATIVE

## 2023-12-20 MED ORDER — OSELTAMIVIR PHOSPHATE 75 MG PO CAPS: 75.0000 mg | ORAL_CAPSULE | Freq: Two times a day (BID) | ORAL | 0 refills | Status: DC

## 2023-12-20 MED ORDER — AMOXICILLIN-POT CLAVULANATE 875-125 MG PO TABS: 1.0000 | ORAL_TABLET | Freq: Two times a day (BID) | ORAL | 0 refills | Status: DC

## 2023-12-20 NOTE — ED Triage Notes (Signed)
 Patient presents to UC for fever, runny nose, sore throat since yesterday. Taking OTC cold meds. Son tested positive for the flu.

## 2023-12-20 NOTE — Discharge Instructions (Addendum)
 Influenza A is a virus and should steadily improve in time it can take up to 7 to 10 days before you truly start to see a turnaround however things will get better  Begin Tamiflu  every morning and every evening for 5 days to reduce the amount of virus in the body which helps to minimize symptoms    Exam your left ear appears to be infected, begin Augmentin  every morning and every evening for 7 days for treatment  You can take Tylenol  and/or Ibuprofen as needed for fever reduction and pain relief.   For cough: honey 1/2 to 1 teaspoon (you can dilute the honey in water or another fluid).  You can also use guaifenesin  and dextromethorphan for cough. You can use a humidifier for chest congestion and cough.  If you don't have a humidifier, you can sit in the bathroom with the hot shower running.      For sore throat: try warm salt water gargles, cepacol lozenges, throat spray, warm tea or water with lemon/honey, popsicles or ice, or OTC cold relief medicine for throat discomfort.   For congestion: take a daily anti-histamine like Zyrtec , Claritin, and a oral decongestant, such as pseudoephedrine .  You can also use Flonase  1-2 sprays in each nostril daily.   It is important to stay hydrated: drink plenty of fluids (water, gatorade/powerade/pedialyte, juices, or teas) to keep your throat moisturized and help further relieve irritation/discomfort.

## 2023-12-20 NOTE — ED Provider Notes (Signed)
 Willie Farmer    CSN: [id number] Arrival date & time: 12/20/23  9162      History   Chief Complaint Chief Complaint  Patient presents with   Fever   Sore Throat   Nasal Congestion    HPI Willie PIET Sr. is a 48 y.o. male.   Patient presents for evaluation of subjective fever, chills, nasal congestion, nonproductive cough, sneezing, sore throat and left-sided ear pain beginning 1 day ago.  Sick contact in household with exposure to influenza.  Has attempted use of DayQuil and NyQuil.  Denies shortness of breath or wheezing.  Poor appetite beginning today.  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   Chronic rhinitis 08/31/2021   Other adverse food reactions, not elsewhere classified, subsequent encounter 08/31/2021   GERD (gastroesophageal reflux disease) 08/20/2018   Mood disorder (HCC) 08/20/2018   Seasonal allergic rhinitis 03/02/2017   Preventative health care 07/13/2016    Past Surgical History:  Procedure Laterality Date   ESOPHAGOGASTRODUODENOSCOPY  08/01/2012   URETEROSCOPY WITH HOLMIUM LASER LITHOTRIPSY Right 10/07/2019   Procedure: URETEROSCOPY / STENT PLACEMENT STONE BASKET RETRIVAL;  Surgeon: Ottelin, Mark, MD;  Location: [facility];  Service: Urology;  Laterality: Right;   WISDOM TOOTH EXTRACTION         Home Medications    Prior to Admission medications   Medication Sig Start Date End Date Taking? Authorizing Provider  amoxicillin -clavulanate (AUGMENTIN ) 875-125 MG tablet Take 1 tablet by mouth every 12 (twelve) hours. 12/20/23  Yes Avaleen Brownley, Shelba SAUNDERS, NP  oseltamivir  (TAMIFLU ) 75 MG capsule Take 1 capsule (75 mg total) by mouth every 12 (twelve) hours. 12/20/23  Yes Teresa Shelba R, NP  ipratropium (ATROVENT ) 0.06 %  nasal spray  11/29/21   [provider]    Family History Family History  Problem Relation Age of Onset   Prostate cancer Father 41   Prostate cancer Paternal Uncle    Prostate cancer Paternal Grandfather    Cancer Paternal Grandfather    Colon cancer Neg Hx    Allergic rhinitis Neg Hx    Asthma Neg Hx    Eczema Neg Hx    Urticaria Neg Hx    Colon polyps Neg Hx    Esophageal cancer Neg Hx    Stomach cancer Neg Hx    Rectal cancer Neg Hx     Social History Social History   Tobacco Use   Smoking status: Never   Smokeless tobacco: Never  Vaping Use   Vaping status: Never Used  Substance Use Topics   Alcohol use: Yes    Comment: occasional   Drug use: Never     Allergies   Covid-19 (mrna bivalent) vaccine (pfizer) [covid-19 (mrna) vaccine], Meclizine, and Latex   Review of Systems Review of Systems   Physical Exam Triage Vital Signs ED Triage Vitals  Encounter Vitals Group     BP 12/20/23 0936 (!) 151/89     Systolic BP Percentile --      Diastolic BP Percentile --      Pulse Rate 12/20/23 0936 63     Resp 12/20/23 0936 18     Temp 12/20/23 0936 100.2 F (37.9 C)     Temp Source  12/20/23 0936 Temporal     SpO2 12/20/23 0936 98 %     Weight --      Height --      Head Circumference --      Peak Flow --      Pain Score 12/20/23 0956 0     Pain Loc --      Pain Education --      Exclude from Growth Chart --    No data found.  Updated Vital Signs BP (!) 151/89 (BP Location: Left Arm)   Pulse 63   Temp 100.2 F (37.9 C) (Temporal)   Resp 18   SpO2 98%   Visual Acuity Right Eye Distance:   Left Eye Distance:   Bilateral Distance:    Right Eye Near:   Left Eye Near:    Bilateral Near:     Physical Exam Constitutional:      Appearance: Normal appearance.  HENT:     Head: Normocephalic.     Right Ear: Tympanic membrane, ear canal and external ear normal.     Left Ear: Ear canal and external ear normal. Tympanic membrane is  erythematous.     Nose: Congestion present. No rhinorrhea.     Mouth/Throat:     Mouth: Mucous membranes are moist.     Pharynx: Oropharynx is clear.  Eyes:     Extraocular Movements: Extraocular movements intact.  Cardiovascular:     Rate and Rhythm: Normal rate and regular rhythm.     Pulses: Normal pulses.     Heart sounds: Normal heart sounds.  Pulmonary:     Effort: Pulmonary effort is normal.     Breath sounds: Normal breath sounds.  Neurological:     Mental Status: He is alert and oriented to person, place, and time. Mental status is at baseline.      UC Treatments / Results  Labs (all labs ordered are listed, but only abnormal results are displayed) Labs Reviewed  POC COVID19/FLU A&B COMBO - Abnormal; Notable for the following components:      Result Value   Influenza A Antigen, POC Positive (*)    All other components within normal limits    EKG   Radiology No results found.  Procedures Procedures (including critical care time)  Medications Ordered in UC Medications - No data to display  Initial Impression / Assessment and Plan / UC Course  I have reviewed the triage vital signs and the nursing notes.  Pertinent labs & imaging results that were available during my care of the patient were reviewed by me and considered in my medical decision making (see chart for details).  Influenza A, nonrecurrent acute serous otitis media of left ear  Patient is in no signs of distress nor toxic appearing.  Vital signs are stable.  Low suspicion for pneumonia, pneumothorax or bronchitis and therefore will defer imaging.  COVID test negative.  Prescribed Tamiflu .  On exam erythema present to the left tympanic membrane, prescribed Augmentin .May use additional over-the-counter medications as needed for supportive care.  May follow-up with urgent care as needed if symptoms persist or worsen.   Final Clinical Impressions(s) / UC Diagnoses   Final diagnoses:  Influenza A   Non-recurrent acute serous otitis media of left ear     Discharge Instructions      Influenza A is a virus and should steadily improve in time it can take up to 7 to 10 days before you truly start to see a turnaround however  things will get better  Begin Tamiflu  every morning and every evening for 5 days to reduce the amount of virus in the body which helps to minimize symptoms    Exam your left ear appears to be infected, begin Augmentin  every morning and every evening for 7 days for treatment  You can take Tylenol  and/or Ibuprofen as needed for fever reduction and pain relief.   For cough: honey 1/2 to 1 teaspoon (you can dilute the honey in water or another fluid).  You can also use guaifenesin  and dextromethorphan for cough. You can use a humidifier for chest congestion and cough.  If you don't have a humidifier, you can sit in the bathroom with the hot shower running.      For sore throat: try warm salt water gargles, cepacol lozenges, throat spray, warm tea or water with lemon/honey, popsicles or ice, or OTC cold relief medicine for throat discomfort.   For congestion: take a daily anti-histamine like Zyrtec , Claritin, and a oral decongestant, such as pseudoephedrine .  You can also use Flonase  1-2 sprays in each nostril daily.   It is important to stay hydrated: drink plenty of fluids (water, gatorade/powerade/pedialyte, juices, or teas) to keep your throat moisturized and help further relieve irritation/discomfort.    ED Prescriptions     Medication Sig Dispense Auth. Provider   oseltamivir  (TAMIFLU ) 75 MG capsule Take 1 capsule (75 mg total) by mouth every 12 (twelve) hours. 10 capsule Elyssia Strausser R, NP   amoxicillin -clavulanate (AUGMENTIN ) 875-125 MG tablet Take 1 tablet by mouth every 12 (twelve) hours. 14 tablet Lyndell Gillyard R, NP      PDMP not reviewed this encounter.   Teresa Shelba SAUNDERS, NP 12/20/23 1004

## 2024-01-18 DIAGNOSIS — F4320 Adjustment disorder, unspecified: Secondary | ICD-10-CM | POA: Diagnosis not present

## 2024-02-07 DIAGNOSIS — F4320 Adjustment disorder, unspecified: Secondary | ICD-10-CM | POA: Diagnosis not present

## 2024-03-08 ENCOUNTER — Ambulatory Visit: Admitting: Family Medicine

## 2024-03-08 ENCOUNTER — Other Ambulatory Visit: Payer: Self-pay

## 2024-03-08 ENCOUNTER — Emergency Department
Admission: EM | Admit: 2024-03-08 | Discharge: 2024-03-08 | Disposition: A | Discharge status: Home / Self Care | Attending: Emergency Medicine | Admitting: Emergency Medicine

## 2024-03-08 DIAGNOSIS — B009 Herpesviral infection, unspecified: Secondary | ICD-10-CM | POA: Diagnosis not present

## 2024-03-08 DIAGNOSIS — Z20828 Contact with and (suspected) exposure to other viral communicable diseases: Secondary | ICD-10-CM

## 2024-03-08 DIAGNOSIS — Z91040 Latex allergy status: Secondary | ICD-10-CM | POA: Diagnosis not present

## 2024-03-08 LAB — URINALYSIS, ROUTINE W REFLEX MICROSCOPIC
Bilirubin Urine: NEGATIVE
Glucose, UA: NEGATIVE mg/dL
Hgb urine dipstick: NEGATIVE
Ketones, ur: NEGATIVE mg/dL
Leukocytes,Ua: NEGATIVE
Nitrite: NEGATIVE
Protein, ur: NEGATIVE mg/dL
Specific Gravity, Urine: 1.025 (ref 1.005–1.030)
pH: 6 (ref 5.0–8.0)

## 2024-03-08 NOTE — Discharge Instructions (Addendum)
 Please follow-up with your primary care doctor for further evaluation and testing.

## 2024-03-08 NOTE — ED Provider Notes (Signed)
 [place] EMERGENCY DEPARTMENT AT [facility] Provider Note   CSN: [id number] Arrival date & time: 03/08/24  1345     History  No chief complaint on file.   Willie BASKETT Sr. is a 48 y.o. male.  Patient here for herpes evaluation.  He notes that his wife recently tested positive for HSV 2 unsure if it was by blood or swab.  Looking for guidance.  Denies any lesions.  No discharge.  No history of known herpes.  He is not concerned for other STDs.        Home Medications Prior to Admission medications   Medication Sig Start Date End Date Taking? Authorizing Provider  amoxicillin -clavulanate (AUGMENTIN ) 875-125 MG tablet Take 1 tablet by mouth every 12 (twelve) hours. 12/20/23   Reena Canning, NP  ipratropium (ATROVENT) 0.06 % nasal spray  11/29/21   [provider]  oseltamivir  (TAMIFLU ) 75 MG capsule Take 1 capsule (75 mg total) by mouth every 12 (twelve) hours. 12/20/23   Reena Canning, NP      Allergies    Covid-19 (mrna bivalent) vaccine Thomasine Flick) [covid-19 (mrna) vaccine], Meclizine, and Latex    Review of Systems   Review of Systems  Constitutional:  Negative for chills and fever.  Genitourinary:  Negative for dysuria, genital sores, hematuria, penile discharge, penile pain and penile swelling.    Physical Exam Updated Vital Signs BP (!) 180/93   Pulse 69   Temp 98 F (36.7 C)   Resp 18   SpO2 100%  Physical Exam Vitals reviewed.  Constitutional:      Appearance: Normal appearance.  HENT:     Head: Normocephalic and atraumatic.     Nose: Nose normal.  Cardiovascular:     Pulses: Normal pulses.  Pulmonary:     Effort: Pulmonary effort is normal.  Musculoskeletal:     Cervical back: Normal range of motion.  Neurological:     Mental Status: He is alert and oriented to person, place, and time. Mental status is at baseline.  Psychiatric:        Mood and Affect: Mood normal.        Behavior: Behavior normal.     ED Results /  Procedures / Treatments   Labs (all labs ordered are listed, but only abnormal results are displayed) Labs Reviewed  URINALYSIS, ROUTINE W REFLEX MICROSCOPIC - Abnormal; Notable for the following components:      Result Value   Color, Urine YELLOW (*)    APPearance CLEAR (*)    All other components within normal limits    EKG None  Radiology No results found.  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  He is fully asymptomatic has no lesions.  He is unsure of next steps.  We did discuss at length potential exposures and further recommendations.  He will follow-up with primary care doctor for further evaluation and likely blood work as this does not appear to be an emergent process necessitating further evaluation here in the ER.  He declined GU examination because  he notes no lesions.  Amount and/or Complexity of Data Reviewed Labs: ordered.           Final Clinical Impression(s) / ED Diagnoses Final diagnoses:  Exposure to herpes simplex virus (HSV)    Rx / DC Orders ED Discharge Orders     None         Hollie Luria, [place] 03/08/24 1627    Bryson Carbine, MD 03/08/24 1704

## 2024-03-08 NOTE — ED Triage Notes (Signed)
 Pt comes with c/o needing to get test for herpes. Pt states wife tested positive for it and he needs to get checked. Pt denies any current symptoms.

## 2024-03-08 NOTE — ED Notes (Signed)
 Pt verbalizes understanding of discharge instructions. Opportunity for questioning and answers were provided. Pt discharged from ED to home.   ? ?

## 2024-03-11 ENCOUNTER — Ambulatory Visit: Admitting: Nurse Practitioner

## 2024-03-11 VITALS — BP 130/84 | HR 70 | Temp 97.4°F | Ht 74.0 in | Wt 169.2 lb

## 2024-03-11 DIAGNOSIS — Z09 Encounter for follow-up examination after completed treatment for conditions other than malignant neoplasm: Secondary | ICD-10-CM | POA: Insufficient documentation

## 2024-03-11 DIAGNOSIS — Z202 Contact with and (suspected) exposure to infections with a predominantly sexual mode of transmission: Secondary | ICD-10-CM | POA: Diagnosis not present

## 2024-03-11 NOTE — Assessment & Plan Note (Signed)
 Reviewed emergency department note along with labs.

## 2024-03-11 NOTE — Assessment & Plan Note (Signed)
 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  Recommended abstaining from sexual intercourse when partner has active lesions and outbreak.  Also discussed with patient and partner about partner being treated with suppressive therapy but they can follow-up with their provider in regards to that.  Did review the medical consensus of not doing HSV serum testing and asymptomatic patients patient is in agreements with this defer testing today

## 2024-03-11 NOTE — Patient Instructions (Signed)
 Nice to see you today I recommend against testing currently  Follow up 6 months, sooner if you need me

## 2024-03-11 NOTE — Progress Notes (Signed)
   Acute Office Visit  Subjective:     Patient ID: Willie ENSING Sr., male    DOB: Aug 30, 1976, 48 y.o.   MRN: [id number]  Chief Complaint  Patient presents with   hosptial follow up    Pt complains of need for blood test for Herpes. Pt states he was in ER and tested negative for STDS but has exposure to Herpes. Pt states ER recommended him to see his PCP due to pt showing no symptoms of Herpes.     HPI Patient is in today for Hospital follow up   Patinet was seen in the ED on 03/08/2024 with concern for exposure for STIs. States that his wife has RA and immunocomprised and having issue regaurding recurrent infections. She recently had a herpic outbreak.  Review of Systems  Genitourinary:  Negative for dysuria and frequency.       Negative for any GU symptomology or lesions        Objective:    BP 130/84   Pulse 70   Temp (!) 97.4 F (36.3 C) (Oral)   Ht 6\' 2"  (1.88 m)   Wt 169 lb 3.2 oz (76.7 kg)   SpO2 97%   BMI 21.72 kg/m  BP Readings from Last 3 Encounters:  03/11/24 130/84  03/08/24 (!) 180/93  12/20/23 (!) 151/89   Wt Readings from Last 3 Encounters:  03/11/24 169 lb 3.2 oz (76.7 kg)  12/07/23 170 lb (77.1 kg)  11/23/23 175 lb (79.4 kg)   SpO2 Readings from Last 3 Encounters:  03/11/24 97%  03/08/24 100%  12/20/23 98%      Physical Exam Vitals and nursing note reviewed.  Constitutional:      Appearance: Normal appearance.  Cardiovascular:     Rate and Rhythm: Normal rate and regular rhythm.     Heart sounds: Normal heart sounds.  Pulmonary:     Effort: Pulmonary effort is normal.     Breath sounds: Normal breath sounds.  Neurological:     Mental Status: He is alert.     No results found for any visits on 03/11/24.      Assessment & Plan:   Problem List Items Addressed This Visit       Other   Hospital discharge follow-up - Primary   Reviewed emergency department note along with labs.      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  Recommended abstaining from sexual intercourse when partner has active lesions and outbreak.  Also discussed with patient and partner about partner being treated with suppressive therapy but they can follow-up with their provider in regards to that.  Did review the medical consensus of not doing HSV serum testing and asymptomatic patients patient is in agreements with this defer testing today       No orders of the defined types were placed in this encounter.   Return in about 6 months (around 09/10/2024) for CPE and Labs.  Margarie Shay, NP

## 2024-03-13 DIAGNOSIS — F4322 Adjustment disorder with anxiety: Secondary | ICD-10-CM | POA: Diagnosis not present

## 2024-03-14 ENCOUNTER — Other Ambulatory Visit (INDEPENDENT_AMBULATORY_CARE_PROVIDER_SITE_OTHER)

## 2024-03-14 ENCOUNTER — Telehealth: Payer: Self-pay

## 2024-03-14 ENCOUNTER — Other Ambulatory Visit (HOSPITAL_COMMUNITY)
Admission: RE | Admit: 2024-03-14 | Discharge: 2024-03-14 | Disposition: A | Discharge status: Home / Self Care | Source: Ambulatory Visit | Attending: Nurse Practitioner | Admitting: Nurse Practitioner

## 2024-03-14 DIAGNOSIS — Z113 Encounter for screening for infections with a predominantly sexual mode of transmission: Secondary | ICD-10-CM

## 2024-03-14 NOTE — Telephone Encounter (Signed)
 Returned pt call.  Pt is scheduled for lab visit only today at 3:30 pm.

## 2024-03-14 NOTE — Telephone Encounter (Signed)
 Orders placed. Please schedule a lab only visit

## 2024-03-14 NOTE — Telephone Encounter (Signed)
 We had converstation in office. Does he want just the HSV blood work or the full STI panel

## 2024-03-14 NOTE — Telephone Encounter (Signed)
 Copied from CRM [id number]. Topic: Clinical - Request for Lab/Test Order >> Mar 14, 2024  9:19 AM Freya Jesus wrote: Reason for CRM: Patient called to schedule lab, advised no lab orders from provider. Patient is requesting blood work and stated his provider knows why.

## 2024-03-14 NOTE — Telephone Encounter (Signed)
 Copied from CRM [id number]. Topic: Clinical - Request for Lab/Test Order >> Mar 14, 2024  9:19 AM Freya Jesus wrote: Reason for CRM: Patient called to schedule lab, advised no lab orders from provider. Patient is requesting blood work and stated his provider knows why. >> Mar 14, 2024  1:21 PM Kita Perish H wrote: Patient states he wants to have STD labs done specifically Herpes, please reach out.  Arty Binning [phone]

## 2024-03-14 NOTE — Telephone Encounter (Signed)
See other encounter.  Pt is scheduled.

## 2024-03-15 LAB — HSV(HERPES SIMPLEX VRS) I + II AB-IGG
HSV 1 IGG,TYPE SPECIFIC AB: <0.9 {index}
HSV 2 IGG,TYPE SPECIFIC AB: 13.4 {index} — ABNORMAL HIGH

## 2024-03-15 LAB — RPR: RPR Ser Ql: NONREACTIVE

## 2024-03-15 LAB — HIV ANTIBODY (ROUTINE TESTING W REFLEX): HIV 1&2 Ab, 4th Generation: NONREACTIVE

## 2024-03-18 ENCOUNTER — Encounter: Payer: Self-pay | Admitting: Nurse Practitioner

## 2024-03-20 LAB — URINE CYTOLOGY ANCILLARY ONLY
Chlamydia: NEGATIVE
Comment: NEGATIVE
Comment: NEGATIVE
Comment: NORMAL
Neisseria Gonorrhea: NEGATIVE
Trichomonas: NEGATIVE

## 2024-04-02 DIAGNOSIS — F4320 Adjustment disorder, unspecified: Secondary | ICD-10-CM | POA: Diagnosis not present

## 2024-04-21 DIAGNOSIS — F4320 Adjustment disorder, unspecified: Secondary | ICD-10-CM | POA: Diagnosis not present

## 2024-04-28 DIAGNOSIS — F4320 Adjustment disorder, unspecified: Secondary | ICD-10-CM | POA: Diagnosis not present

## 2024-05-05 DIAGNOSIS — F4320 Adjustment disorder, unspecified: Secondary | ICD-10-CM | POA: Diagnosis not present

## 2024-05-10 DIAGNOSIS — F4320 Adjustment disorder, unspecified: Secondary | ICD-10-CM | POA: Diagnosis not present

## 2024-05-27 ENCOUNTER — Encounter: Payer: Self-pay | Admitting: Emergency Medicine

## 2024-05-27 ENCOUNTER — Ambulatory Visit
Admission: EM | Admit: 2024-05-27 | Discharge: 2024-05-27 | Disposition: A | Discharge status: Home / Self Care | Attending: Emergency Medicine | Admitting: Emergency Medicine

## 2024-05-27 DIAGNOSIS — J019 Acute sinusitis, unspecified: Secondary | ICD-10-CM | POA: Diagnosis not present

## 2024-05-27 DIAGNOSIS — B9789 Other viral agents as the cause of diseases classified elsewhere: Secondary | ICD-10-CM | POA: Diagnosis not present

## 2024-05-27 MED ORDER — PREDNISONE 10 MG (21) PO TBPK: ORAL_TABLET | Freq: Every day | ORAL | 0 refills | Status: DC

## 2024-05-27 NOTE — Discharge Instructions (Signed)
 Your symptoms today are most likely being caused by a virus and should steadily improve in time it can take up to 7 to 10 days before you truly start to see a turnaround however things will get better  Continue use of Sudafed as it has been helpful  May use prednisone  if sinus pressure or headaches continue to persist without improvement    You can take Tylenol  and/or Ibuprofen as needed for fever reduction and pain relief.   For cough: honey 1/2 to 1 teaspoon (you can dilute the honey in water or another fluid).  You can also use guaifenesin  and dextromethorphan for cough. You can use a humidifier for chest congestion and cough.  If you don't have a humidifier, you can sit in the bathroom with the hot shower running.      For sore throat: try warm salt water gargles, cepacol lozenges, throat spray, warm tea or water with lemon/honey, popsicles or ice, or OTC cold relief medicine for throat discomfort.   For congestion: take a daily anti-histamine like Zyrtec , Claritin, and a oral decongestant, such as pseudoephedrine .  You can also use Flonase  1-2 sprays in each nostril daily.   It is important to stay hydrated: drink plenty of fluids (water, gatorade/powerade/pedialyte, juices, or teas) to keep your throat moisturized and help further relieve irritation/discomfort.

## 2024-05-27 NOTE — ED Triage Notes (Signed)
 Patient reports fever, pressure in the top of head and pressure behind eyes x1 day. Took sudafed yesterday. Took Tylenol  today at about 9 am..

## 2024-05-27 NOTE — ED Provider Notes (Signed)
 Willie Farmer    CSN: [id number] Arrival date & time: 05/27/24  1612      History   Chief Complaint Chief Complaint  Patient presents with   Headache   Fever    HPI Willie LACHNEY Sr. is a 48 y.o. male.   Resents for evaluation of fever peaking at 99, nasal congestion, sinus pressure behind the eyes and headache to the top of the head present for 1 day.  No known sick contacts but did attend large gathering prior to symptoms beginning.  Has attempted use of Sudafed and Tylenol  which has been somewhat helpful.  Denies ear pain, sore throat, cough.  Tolerating food and liquids but appetite is decreased.  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   Chronic rhinitis 08/31/2021   Other adverse food reactions, not elsewhere classified, subsequent encounter 08/31/2021   GERD (gastroesophageal reflux disease) 08/20/2018   Mood disorder (HCC) 08/20/2018   Seasonal allergic rhinitis 03/02/2017   Preventative health care 07/13/2016    Past Surgical History:  Procedure Laterality Date   ESOPHAGOGASTRODUODENOSCOPY  08/01/2012   URETEROSCOPY WITH HOLMIUM LASER LITHOTRIPSY Right 10/07/2019   Procedure: URETEROSCOPY / STENT PLACEMENT STONE BASKET RETRIVAL;  Surgeon: Ottelin, Mark, MD;  Location: [facility];  Service: Urology;  Laterality: Right;   WISDOM TOOTH EXTRACTION         Home Medications    Prior to Admission medications   Medication Sig Start Date End Date Taking? Authorizing Provider  predniSONE  (STERAPRED UNI-PAK 21 TAB) 10 MG (21) TBPK tablet Take by mouth daily. Take 6 tabs by mouth daily  for 1 days, then 5 tabs for 1 days, then 4 tabs for 1 days,  then 3 tabs for 1 days, 2 tabs for 1 days, then 1 tab by mouth daily for 1 days 05/27/24  Yes Cornelious Bartolucci R, NP  amoxicillin -clavulanate (AUGMENTIN ) 875-125 MG tablet Take 1 tablet by mouth every 12 (twelve) hours. Patient not taking: Reported on 03/11/2024 12/20/23   Bernadine Melecio R, NP  ipratropium (ATROVENT) 0.06 % nasal spray  11/29/21   [provider]  oseltamivir  (TAMIFLU ) 75 MG capsule Take 1 capsule (75 mg total) by mouth every 12 (twelve) hours. Patient not taking: Reported on 03/11/2024 12/20/23   Teresa Shelba SAUNDERS, NP    Family History Family History  Problem Relation Age of Onset   Prostate cancer Father 24   Prostate cancer Paternal Uncle    Prostate cancer Paternal Grandfather    Cancer Paternal Grandfather    Colon cancer Neg Hx    Allergic rhinitis Neg Hx    Asthma Neg Hx    Eczema Neg Hx    Urticaria Neg Hx    Colon polyps Neg Hx    Esophageal cancer Neg Hx    Stomach cancer Neg Hx    Rectal cancer Neg Hx     Social History Social History   Tobacco Use   Smoking status: Never   Smokeless tobacco: Never  Vaping Use   Vaping status: Never Used  Substance Use Topics   Alcohol use: Yes    Comment: occasional   Drug use:  Never     Allergies   Covid-19 (mrna bivalent) vaccine (pfizer) [covid-19 (mrna) vaccine], Meclizine, and Latex   Review of Systems Review of Systems   Physical Exam Triage Vital Signs ED Triage Vitals  Encounter Vitals Group     BP 05/27/24 1658 130/87     Girls Systolic BP Percentile --      Girls Diastolic BP Percentile --      Boys Systolic BP Percentile --      Boys Diastolic BP Percentile --      Pulse Rate 05/27/24 1658 80     Resp 05/27/24 1658 18     Temp 05/27/24 1658 98.6 F (37 C)     Temp Source 05/27/24 1658 Oral     SpO2 05/27/24 1658 99 %     Weight --      Height --      Head Circumference --      Peak Flow --      Pain Score 05/27/24 1701 8     Pain Loc --      Pain Education --      Exclude  from Growth Chart --    No data found.  Updated Vital Signs BP 130/87 (BP Location: Left Arm)   Pulse 80   Temp 98.6 F (37 C) (Oral)   Resp 18   SpO2 99%   Visual Acuity Right Eye Distance:   Left Eye Distance:   Bilateral Distance:    Right Eye Near:   Left Eye Near:    Bilateral Near:     Physical Exam Constitutional:      Appearance: Normal appearance.  HENT:     Right Ear: Tympanic membrane, ear canal and external ear normal.     Left Ear: Tympanic membrane, ear canal and external ear normal.     Nose: Congestion present.     Mouth/Throat:     Mouth: Mucous membranes are moist.     Pharynx: Oropharynx is clear. No oropharyngeal exudate or posterior oropharyngeal erythema.  Eyes:     Extraocular Movements: Extraocular movements intact.  Cardiovascular:     Rate and Rhythm: Normal rate and regular rhythm.     Pulses: Normal pulses.     Heart sounds: Normal heart sounds.  Pulmonary:     Effort: Pulmonary effort is normal.     Breath sounds: Normal breath sounds.  Musculoskeletal:     Cervical back: Normal range of motion and neck supple.  Neurological:     Mental Status: He is alert and oriented to person, place, and time. Mental status is at baseline.      UC Treatments / Results  Labs (all labs ordered are listed, but only abnormal results are displayed) Labs Reviewed - No data to display  EKG   Radiology No results found.  Procedures Procedures (including critical care time)  Medications Ordered in UC Medications - No data to display  Initial Impression / Assessment and Plan / UC Course  I have reviewed the triage vital signs and the nursing notes.  Pertinent labs & imaging results that were available during my care of the patient were reviewed by me and considered in my medical decision making (see chart for details).  Acute viral sinusitis  Patient is in no signs of distress nor toxic appearing.  Vital signs are stable.  Low suspicion for  pneumonia, pneumothorax or bronchitis and therefore will defer imaging.  Prescribed prednisone , declined nasal spray.May use additional over-the-counter medications as  needed for supportive care.  May follow-up with urgent care as needed if symptoms persist or worsen.  Final Clinical Impressions(s) / UC Diagnoses   Final diagnoses:  Acute viral sinusitis     Discharge Instructions      Your symptoms today are most likely being caused by a virus and should steadily improve in time it can take up to 7 to 10 days before you truly start to see a turnaround however things will get better  Continue use of Sudafed as it has been helpful  May use prednisone  if sinus pressure or headaches continue to persist without improvement    You can take Tylenol  and/or Ibuprofen as needed for fever reduction and pain relief.   For cough: honey 1/2 to 1 teaspoon (you can dilute the honey in water or another fluid).  You can also use guaifenesin  and dextromethorphan for cough. You can use a humidifier for chest congestion and cough.  If you don't have a humidifier, you can sit in the bathroom with the hot shower running.      For sore throat: try warm salt water gargles, cepacol lozenges, throat spray, warm tea or water with lemon/honey, popsicles or ice, or OTC cold relief medicine for throat discomfort.   For congestion: take a daily anti-histamine like Zyrtec , Claritin, and a oral decongestant, such as pseudoephedrine .  You can also use Flonase  1-2 sprays in each nostril daily.   It is important to stay hydrated: drink plenty of fluids (water, gatorade/powerade/pedialyte, juices, or teas) to keep your throat moisturized and help further relieve irritation/discomfort.    ED Prescriptions     Medication Sig Dispense Auth. Provider   predniSONE  (STERAPRED UNI-PAK 21 TAB) 10 MG (21) TBPK tablet Take by mouth daily. Take 6 tabs by mouth daily  for 1 days, then 5 tabs for 1 days, then 4 tabs for 1 days, then  3 tabs for 1 days, 2 tabs for 1 days, then 1 tab by mouth daily for 1 days 21 tablet Luxe Cuadros, Shelba SAUNDERS, NP      PDMP not reviewed this encounter.   Teresa Shelba SAUNDERS, NP 05/27/24 1740

## 2024-06-07 ENCOUNTER — Telehealth: Payer: Self-pay | Admitting: Nurse Practitioner

## 2024-06-07 MED ORDER — IPRATROPIUM BROMIDE 0.06 % NA SOLN
2.0000 | Freq: Four times a day (QID) | NASAL | 0 refills | Status: DC
Start: 2024-06-07 — End: 2024-08-08

## 2024-06-07 NOTE — Telephone Encounter (Signed)
 Copied from CRM [id number]. Topic: Clinical - Prescription Issue >> Jun 07, 2024 11:31 AM Lavanda D wrote: Reason for CRM: ENT went out of business and he has a condition that causes over production of mucus, nasal spray has been working for him but since he is no longer able to get it from his prescribing physician. ipratropium (ATROVENT) 0.06 % nasal spray

## 2024-06-07 NOTE — Addendum Note (Signed)
 Addended by: WENDEE LYNWOOD HERO on: 06/07/2024 01:53 PM   Modules accepted: Orders

## 2024-06-09 DIAGNOSIS — F4320 Adjustment disorder, unspecified: Secondary | ICD-10-CM | POA: Diagnosis not present

## 2024-06-30 DIAGNOSIS — F4320 Adjustment disorder, unspecified: Secondary | ICD-10-CM | POA: Diagnosis not present

## 2024-07-25 ENCOUNTER — Ambulatory Visit
Admission: EM | Admit: 2024-07-25 | Discharge: 2024-07-25 | Disposition: A | Discharge status: Home / Self Care | Attending: Emergency Medicine | Admitting: Emergency Medicine

## 2024-07-25 DIAGNOSIS — J069 Acute upper respiratory infection, unspecified: Secondary | ICD-10-CM | POA: Diagnosis not present

## 2024-07-25 LAB — POC SOFIA SARS ANTIGEN FIA: SARS Coronavirus 2 Ag: NEGATIVE

## 2024-07-25 MED ORDER — BROMPHENIRAMINE-PHENYLEPHRINE 1-2.5 MG/5ML PO ELIX: 5.0000 mL | ORAL_SOLUTION | Freq: Four times a day (QID) | ORAL | 0 refills | Status: DC | PRN

## 2024-07-25 MED ORDER — PSEUDOEPH-BROMPHEN-DM 30-2-10 MG/5ML PO SYRP
2.5000 mL | ORAL_SOLUTION | Freq: Four times a day (QID) | ORAL | 0 refills | Status: DC | PRN
Start: 2024-07-25 — End: 2024-08-12

## 2024-07-25 NOTE — ED Triage Notes (Signed)
 Patient to Urgent Care with complaints of fevers/ runny nose/ sore throat/ sneezing/ sinus pressure. One episode of vomiting d/t drainage.   Symptoms x2 days.   Using prescribed nasal spray

## 2024-07-25 NOTE — ED Provider Notes (Signed)
 [name]    CSN: [id number] Arrival date & time: 07/25/24  1018      History   Chief Complaint Chief Complaint  Patient presents with   Fever   Nasal Congestion   Sore Throat    HPI Willie Farmer Sr. is a 48 y.o. male.  Patient presents with 2-day history of fever, runny nose, sneezing, sinus pressure, sore throat, cough.  He had 1 episode of emesis last night which he attributes to postnasal drip.  No shortness of breath, diarrhea, rash.  No OTC medication taken today but took Sudafed yesterday.  Patient was seen at this urgent care on 05/27/2024; diagnosed with viral sinusitis; treated with prednisone .  He was prescribed ipratropium nasal spray on 06/07/2024 by his PCP.  The history is provided by the patient and medical records.    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   Chronic rhinitis 08/31/2021   Other adverse food reactions, not elsewhere classified, subsequent encounter 08/31/2021   GERD (gastroesophageal reflux disease) 08/20/2018   Mood disorder (HCC) 08/20/2018   Seasonal allergic rhinitis 03/02/2017   Preventative health care 07/13/2016    Past Surgical History:  Procedure Laterality Date   ESOPHAGOGASTRODUODENOSCOPY  08/01/2012   URETEROSCOPY WITH HOLMIUM LASER LITHOTRIPSY Right 10/07/2019   Procedure: URETEROSCOPY / STENT PLACEMENT STONE BASKET RETRIVAL;  Surgeon: Ottelin, Mark, MD;  Location: [facility];  Service: Urology;  Laterality: Right;   WISDOM TOOTH EXTRACTION         Home Medications    Prior to Admission medications   Medication Sig Start Date End Date Taking? Authorizing Provider  Brompheniramine -Phenylephrine   1-2.5 MG/5ML syrup Take 5 mLs by mouth every 6 (six) hours as needed for cough. 07/25/24  Yes Corlis Burnard DEL, NP  ipratropium (ATROVENT ) 0.06 % nasal spray Place 2 sprays into both nostrils 4 (four) times daily. 06/07/24   Wendee Lynwood HERO, NP    Family History Family History  Problem Relation Age of Onset   Prostate cancer Father 5   Prostate cancer Paternal Uncle    Prostate cancer Paternal Grandfather    Cancer Paternal Grandfather    Colon cancer Neg Hx    Allergic rhinitis Neg Hx    Asthma Neg Hx    Eczema Neg Hx    Urticaria Neg Hx    Colon polyps Neg Hx    Esophageal cancer Neg Hx    Stomach cancer Neg Hx    Rectal cancer Neg Hx     Social History Social History   Tobacco Use   Smoking status: Never   Smokeless tobacco: Never  Vaping Use   Vaping status: Never Used  Substance Use Topics   Alcohol use: Yes    Comment: occasional   Drug use: Never     Allergies   Covid-19 (mrna bivalent) vaccine (pfizer) [covid-19 (mrna) vaccine], Meclizine, and Latex   Review of Systems Review of Systems  Constitutional:  Positive for fever. Negative for chills.  HENT:  Positive for postnasal drip, rhinorrhea, sinus pressure, sneezing and sore throat. Negative for ear pain.   Respiratory:  Positive for cough. Negative for shortness of  breath.   Gastrointestinal:  Positive for vomiting. Negative for diarrhea.     Physical Exam Triage Vital Signs ED Triage Vitals [07/25/24 1039]  Encounter Vitals Group     BP (!) 131/90     Girls Systolic BP Percentile      Girls Diastolic BP Percentile      Boys Systolic BP Percentile      Boys Diastolic BP Percentile      Pulse Rate 90     Resp 16     Temp 98 F (36.7 C)     Temp src      SpO2 99 %     Weight      Height      Head Circumference      Peak Flow      Pain Score      Pain Loc      Pain Education      Exclude from Growth Chart    No data found.  Updated Vital Signs BP (!) 131/90   Pulse 90   Temp 98 F (36.7  C)   Resp 16   SpO2 99%   Visual Acuity Right Eye Distance:   Left Eye Distance:   Bilateral Distance:    Right Eye Near:   Left Eye Near:    Bilateral Near:     Physical Exam Constitutional:      General: He is not in acute distress. HENT:     Right Ear: Tympanic membrane normal.     Left Ear: Tympanic membrane normal.     Nose: Rhinorrhea present.     Mouth/Throat:     Mouth: Mucous membranes are moist.     Pharynx: Oropharynx is clear.  Cardiovascular:     Rate and Rhythm: Normal rate and regular rhythm.     Heart sounds: Normal heart sounds.  Pulmonary:     Effort: Pulmonary effort is normal. No respiratory distress.     Breath sounds: Normal breath sounds.  Abdominal:     General: Bowel sounds are normal.     Palpations: Abdomen is soft.     Tenderness: There is no abdominal tenderness. There is no guarding or rebound.  Neurological:     Mental Status: He is alert.      UC Treatments / Results  Labs (all labs ordered are listed, but only abnormal results are displayed) Labs Reviewed  POC SOFIA SARS ANTIGEN FIA - Normal    EKG   Radiology No results found.  Procedures Procedures (including critical care time)  Medications Ordered in UC Medications - No data to display  Initial Impression / Assessment and Plan / UC Course  I have reviewed the triage vital signs and the nursing notes.  Pertinent labs & imaging results that were available during my care of the patient were reviewed by me and considered in my medical decision making (see chart for details).    Viral URI.  Afebrile and vital signs are stable.  Lungs are clear and O2 sat is 99% on room air.  Patient has been symptomatic for 2 days.  Rapid COVID test is negative.  Treating today with brompheniramine -phenylephrine .  Tylenol  as needed.  Education provided on viral respiratory infection.  Instructed patient to follow-up with his PCP if he is not improving.  He agrees to plan of  care.  Final Clinical Impressions(s) / UC Diagnoses   Final diagnoses:  Viral URI     Discharge Instructions      Your  COVID test is negative.  Take the brompheniramine -phenylephrine  as directed.  Follow-up with your primary care provider if your symptoms are not improving.      ED Prescriptions     Medication Sig Dispense Auth. Provider   Brompheniramine -Phenylephrine  1-2.5 MG/5ML syrup Take 5 mLs by mouth every 6 (six) hours as needed for cough. 118 mL Corlis Burnard DEL, NP      PDMP not reviewed this encounter.   Corlis Burnard DEL, NP 07/25/24 1128

## 2024-07-25 NOTE — Discharge Instructions (Addendum)
 Your COVID test is negative.  Take the brompheniramine -phenylephrine  as directed.  Follow-up with your primary care provider if your symptoms are not improving.

## 2024-08-06 ENCOUNTER — Ambulatory Visit
Admission: EM | Admit: 2024-08-06 | Discharge: 2024-08-06 | Disposition: A | Discharge status: Home / Self Care | Attending: Emergency Medicine | Admitting: Emergency Medicine

## 2024-08-06 DIAGNOSIS — J069 Acute upper respiratory infection, unspecified: Secondary | ICD-10-CM

## 2024-08-06 MED ORDER — BENZONATATE 100 MG PO CAPS: 100.0000 mg | ORAL_CAPSULE | Freq: Three times a day (TID) | ORAL | 0 refills | Status: DC

## 2024-08-06 MED ORDER — AMOXICILLIN-POT CLAVULANATE 875-125 MG PO TABS: 1.0000 | ORAL_TABLET | Freq: Two times a day (BID) | ORAL | 0 refills | Status: DC

## 2024-08-06 NOTE — Discharge Instructions (Signed)
 As your symptoms have persisted for 2 weeks without resolving on its own you will be placed on an antibiotic to her back there which bacteria  Take Augmentin  twice daily for 7 days  You may use Tessalon  pill every 8 hours as needed for cough    You can take Tylenol  and/or Ibuprofen as needed for fever reduction and pain relief.   For cough: honey 1/2 to 1 teaspoon (you can dilute the honey in water or another fluid).  You can also use guaifenesin  and dextromethorphan for cough. You can use a humidifier for chest congestion and cough.  If you don't have a humidifier, you can sit in the bathroom with the hot shower running.      For sore throat: try warm salt water gargles, cepacol lozenges, throat spray, warm tea or water with lemon/honey, popsicles or ice, or OTC cold relief medicine for throat discomfort.   For congestion: take a daily anti-histamine like Zyrtec , Claritin, and a oral decongestant, such as pseudoephedrine .  You can also use Flonase  1-2 sprays in each nostril daily.   It is important to stay hydrated: drink plenty of fluids (water, gatorade/powerade/pedialyte, juices, or teas) to keep your throat moisturized and help further relieve irritation/discomfort.

## 2024-08-06 NOTE — ED Provider Notes (Signed)
 [name]    CSN: [id number] Arrival date & time: 08/06/24  0827      History   Chief Complaint Chief Complaint  Patient presents with   Cough   Nasal Congestion    HPI BRALLAN DENIO Sr. is a 48 y.o. male.   Patient presents for evaluation of a persisting productive cough and nasal congestion beginning 2 weeks ago.  Initial sore throat and fever has resolved.  Thick mucus has called episodes of vomiting with last occurrence 1 day ago.  Had been experiencing persistent diarrhea with foul odor but relates this to use of prescribed cough medicine which also caused aggression therefore he discontinued.  Additionally has attempted use of DayQuil and NyQuil.  Denies known sick contact prior.  Tolerable to food and liquids.  Denies wheezing or shortness of breath.   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   Chronic rhinitis 08/31/2021   Other adverse food reactions, not elsewhere classified, subsequent encounter 08/31/2021   GERD (gastroesophageal reflux disease) 08/20/2018   Mood disorder 08/20/2018   Seasonal allergic rhinitis 03/02/2017   Preventative health care 07/13/2016    Past Surgical History:  Procedure Laterality Date   ESOPHAGOGASTRODUODENOSCOPY  08/01/2012   URETEROSCOPY WITH HOLMIUM LASER LITHOTRIPSY Right 10/07/2019   Procedure: URETEROSCOPY / STENT PLACEMENT STONE BASKET RETRIVAL;  Surgeon: Ottelin, Mark, MD;  Location: [facility];  Service: Urology;  Laterality: Right;   WISDOM TOOTH EXTRACTION         Home Medications    Prior to Admission medications   Medication Sig Start Date End Date Taking? Authorizing Provider   amoxicillin -clavulanate (AUGMENTIN ) 875-125 MG tablet Take 1 tablet by mouth every 12 (twelve) hours. 08/06/24  Yes Shaneil Yazdi R, NP  benzonatate  (TESSALON ) 100 MG capsule Take 1 capsule (100 mg total) by mouth every 8 (eight) hours. 08/06/24  Yes Rosaleah Person R, NP  brompheniramine -pseudoephedrine -DM 30-2-10 MG/5ML syrup Take 2.5 mLs by mouth 4 (four) times daily as needed. Patient not taking: Reported on 08/06/2024 07/25/24   Corlis Burnard DEL, NP  ipratropium (ATROVENT ) 0.06 % nasal spray Place 2 sprays into both nostrils 4 (four) times daily. 06/07/24   Wendee Lynwood HERO, NP    Family History Family History  Problem Relation Age of Onset   Prostate cancer Father 93   Prostate cancer Paternal Uncle    Prostate cancer Paternal Grandfather    Cancer Paternal Grandfather    Colon cancer Neg Hx    Allergic rhinitis Neg Hx    Asthma Neg Hx    Eczema Neg Hx    Urticaria Neg Hx    Colon polyps Neg Hx    Esophageal cancer Neg Hx    Stomach cancer Neg Hx    Rectal cancer Neg Hx     Social History Social History   Tobacco Use   Smoking status: Never   Smokeless tobacco: Never  Vaping Use   Vaping status: Never Used  Substance Use Topics   Alcohol use: Yes    Comment: occasional   Drug use: Never     Allergies   Covid-19 (mrna bivalent) vaccine (pfizer) [covid-19 (mrna)  vaccine], Meclizine, Latex, and Other   Review of Systems Review of Systems   Physical Exam Triage Vital Signs ED Triage Vitals  Encounter Vitals Group     BP 08/06/24 0920 128/86     Girls Systolic BP Percentile --      Girls Diastolic BP Percentile --      Boys Systolic BP Percentile --      Boys Diastolic BP Percentile --      Pulse Rate 08/06/24 0920 60     Resp 08/06/24 0920 17     Temp 08/06/24 0920 98 F (36.7 C)     Temp src --      SpO2 08/06/24 0920 98 %     Weight --      Height --      Head Circumference --      Peak Flow --      Pain Score 08/06/24 0919 0     Pain Loc --       Pain Education --      Exclude from Growth Chart --    No data found.  Updated Vital Signs BP 128/86   Pulse 60   Temp 98 F (36.7 C)   Resp 17   SpO2 98%   Visual Acuity Right Eye Distance:   Left Eye Distance:   Bilateral Distance:    Right Eye Near:   Left Eye Near:    Bilateral Near:     Physical Exam Constitutional:      Appearance: Normal appearance.  HENT:     Right Ear: Tympanic membrane, ear canal and external ear normal.     Left Ear: Tympanic membrane, ear canal and external ear normal.     Nose: Congestion present.     Mouth/Throat:     Pharynx: No posterior oropharyngeal erythema.  Eyes:     Extraocular Movements: Extraocular movements intact.  Cardiovascular:     Rate and Rhythm: Normal rate and regular rhythm.     Pulses: Normal pulses.     Heart sounds: Normal heart sounds.  Pulmonary:     Effort: Pulmonary effort is normal.     Breath sounds: Normal breath sounds.  Neurological:     Mental Status: He is alert and oriented to person, place, and time. Mental status is at baseline.      UC Treatments / Results  Labs (all labs ordered are listed, but only abnormal results are displayed) Labs Reviewed - No data to display  EKG   Radiology No results found.  Procedures Procedures (including critical care time)  Medications Ordered in UC Medications - No data to display  Initial Impression / Assessment and Plan / UC Course  I have reviewed the triage vital signs and the nursing notes.  Pertinent labs & imaging results that were available during my care of the patient were reviewed by me and considered in my medical decision making (see chart for details).  Acute URI  Patient is in no signs of distress nor toxic appearing.  Vital signs are stable.  Low suspicion for pneumonia, pneumothorax or bronchitis and therefore will defer imaging.  Viral testing deferred, completed on 07/25/2024.  Symptoms present for 2 weeks without resolution  initiating antibiotic, prescribed Augmentin  as well as Tessalon  for cough.May use additional over-the-counter medications as needed for supportive care.  May follow-up with urgent care as needed if symptoms persist or worsen.    Final Clinical Impressions(s) / UC Diagnoses   Final diagnoses:  Acute  URI     Discharge Instructions      As your symptoms have persisted for 2 weeks without resolving on its own you will be placed on an antibiotic to her back there which bacteria  Take Augmentin  twice daily for 7 days  You may use Tessalon  pill every 8 hours as needed for cough    You can take Tylenol  and/or Ibuprofen as needed for fever reduction and pain relief.   For cough: honey 1/2 to 1 teaspoon (you can dilute the honey in water or another fluid).  You can also use guaifenesin  and dextromethorphan for cough. You can use a humidifier for chest congestion and cough.  If you don't have a humidifier, you can sit in the bathroom with the hot shower running.      For sore throat: try warm salt water gargles, cepacol lozenges, throat spray, warm tea or water with lemon/honey, popsicles or ice, or OTC cold relief medicine for throat discomfort.   For congestion: take a daily anti-histamine like Zyrtec , Claritin, and a oral decongestant, such as pseudoephedrine .  You can also use Flonase  1-2 sprays in each nostril daily.   It is important to stay hydrated: drink plenty of fluids (water, gatorade/powerade/pedialyte, juices, or teas) to keep your throat moisturized and help further relieve irritation/discomfort.     ED Prescriptions     Medication Sig Dispense Auth. Provider   amoxicillin -clavulanate (AUGMENTIN ) 875-125 MG tablet Take 1 tablet by mouth every 12 (twelve) hours. 14 tablet Lashauna Arpin R, NP   benzonatate  (TESSALON ) 100 MG capsule Take 1 capsule (100 mg total) by mouth every 8 (eight) hours. 21 capsule Kerrick Miler, Shelba SAUNDERS, NP      PDMP not reviewed this encounter.    Teresa Shelba SAUNDERS, NP 08/06/24 1053

## 2024-08-06 NOTE — ED Triage Notes (Signed)
 Patient to Urgent Care with complaints of runny nose/ productive cough/ hot flashes. Three episodes of emesis.   Symptoms x2 weeks. Using atrovent  nasal spray and started prescribed cough syrup (felt like this made him aggressive so he stopped).

## 2024-08-07 ENCOUNTER — Other Ambulatory Visit: Payer: Self-pay | Admitting: Nurse Practitioner

## 2024-08-12 ENCOUNTER — Ambulatory Visit: Admitting: Nurse Practitioner

## 2024-08-12 ENCOUNTER — Ambulatory Visit: Admitting: General Practice

## 2024-08-12 ENCOUNTER — Encounter: Payer: Self-pay | Admitting: General Practice

## 2024-08-12 VITALS — BP 112/64 | HR 60 | Temp 98.1°F | Ht 74.0 in | Wt 168.0 lb

## 2024-08-12 DIAGNOSIS — J069 Acute upper respiratory infection, unspecified: Secondary | ICD-10-CM | POA: Diagnosis not present

## 2024-08-12 NOTE — Progress Notes (Signed)
 Established Patient Office Visit  Subjective   Patient ID: Willie Vincent., male    DOB: 11-05-1976  Age: 48 y.o. MRN: [id number]  Chief Complaint  Patient presents with   Cough    Sore throat, vomiting, diarrhea x 2 weeks. Patient is better now other then cough and slight runny nose since Friday. Patients wife wanted his lungs checked.  Has about 2 days left of amoxicillin .     Cough Pertinent negatives include no chest pain, chills, ear pain, fever, headaches, heartburn, sore throat, shortness of breath or wheezing.    Willie GORMAN Jinx Sr. Is a 48 year old male, patient of Adina Crandall, NP, presents today for an acute visit.   Discussed the use of AI scribe software for clinical note transcription with the patient, who gave verbal consent to proceed.  History of Present Illness Willie Alcocer. is a 48 year old male who presents with persistent respiratory symptoms following a viral illness.  His symptoms began approximately two and a half weeks ago after cleaning up vomit at a boxing gym. Initially, he experienced a sore throat, vomiting, diarrhea, fever, and chills, which led to two urgent care visits on September 11th and September 23rd. Most symptoms have resolved, but he continues to experience occasional coughing and a runny nose.  He experienced sinus pain and was prescribed Augmentin . He is currently finishing his course of antibiotics, with one to two days remaining.  He has taken Pepto-Bismol for gastrointestinal symptoms but experienced bloating and vomiting after taking it. He used benzonatate  (Tessalon  Perles) for his cough, which has been effective. He has a few days' supply remaining and does not feel the need for more at this time.  No current sore throat, ear pain, sinus pain, dizziness, headaches, urinary symptoms, or vomiting. His wife was concerned about wheezing, but neither he nor his wife have heard any wheezing. No shortness of breath.   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   Chronic rhinitis 08/31/2021   Other adverse food reactions, not elsewhere classified, subsequent encounter 08/31/2021   GERD (gastroesophageal reflux disease) 08/20/2018   Mood disorder 08/20/2018   Seasonal allergic rhinitis 03/02/2017   Preventative health care 07/13/2016   Past Medical History:  Diagnosis Date   History of anal fissures 2015   History of viral meningitis 1995   Right ureteral stone    Seasonal allergies    Wears glasses    Past Surgical History:  Procedure Laterality Date   ESOPHAGOGASTRODUODENOSCOPY  08/01/2012   URETEROSCOPY WITH HOLMIUM LASER LITHOTRIPSY Right 10/07/2019   Procedure: URETEROSCOPY / STENT PLACEMENT STONE BASKET RETRIVAL;  Surgeon: Ottelin, Mark, MD;  Location: [facility];  Service: Urology;  Laterality: Right;   WISDOM TOOTH EXTRACTION     Allergies  Allergen Reactions   Covid-19 (Mrna Bivalent) Vaccine Proofreader) [Covid-19 (Mrna) Vaccine] Other (See Comments)    Lesions in mouth lost sight on side injected    Meclizine Hives    Blisters, stuffy nose   Latex Rash   Other Other (See Comments)    brompheniramine -pseudoephedrine -DM 30-2-10 MG/5ML syrup- reports this made him aggressive          08/12/2024    8:21 AM 03/11/2024   12:16 PM 08/17/2023    8:58 AM  Depression screen PHQ 2/9  Decreased Interest 0 0 0  Down, Depressed,  Hopeless 0 0 0  PHQ - 2 Score 0 0 0  Altered sleeping 2 1 2   Tired, decreased energy 0 0 0  Change in appetite 1 0 0  Feeling bad or failure about yourself  0 0 0  Trouble concentrating 0 1 0  Moving slowly or fidgety/restless 0 0 0  Suicidal thoughts 0 0 0  PHQ-9 Score 3 2 2   Difficult doing work/chores Somewhat difficult Not difficult at all Not difficult at all       08/12/2024    8:21 AM 03/11/2024   12:16 PM 08/17/2023    8:58 AM  GAD 7 :  Generalized Anxiety Score  Nervous, Anxious, on Edge 0 0 0  Control/stop worrying 0 0 0  Worry too much - different things 0 0 0  Trouble relaxing 0 0 0  Restless 0 0 0  Easily annoyed or irritable 2 0 0  Afraid - awful might happen 1 0 0  Total GAD 7 Score 3 0 0  Anxiety Difficulty Somewhat difficult Not difficult at all Not difficult at all      Review of Systems  Constitutional:  Negative for chills and fever.  HENT:  Negative for congestion, ear pain, sinus pain and sore throat.   Respiratory:  Positive for cough. Negative for shortness of breath and wheezing.   Cardiovascular:  Negative for chest pain.  Gastrointestinal:  Negative for abdominal pain, constipation, diarrhea, heartburn, nausea and vomiting.  Genitourinary:  Negative for dysuria, frequency and urgency.  Neurological:  Negative for dizziness and headaches.  Endo/Heme/Allergies:  Negative for polydipsia.  Psychiatric/Behavioral:  Negative for depression and suicidal ideas. The patient is not nervous/anxious.       Objective:     BP 112/64   Pulse 60   Temp 98.1 F (36.7 C) (Oral)   Ht 6' 2 (1.88 m)   Wt 168 lb (76.2 kg)   SpO2 97%   BMI 21.57 kg/m  BP Readings from Last 3 Encounters:  08/12/24 112/64  08/06/24 128/86  07/25/24 (!) 131/90   Wt Readings from Last 3 Encounters:  08/12/24 168 lb (76.2 kg)  03/11/24 169 lb 3.2 oz (76.7 kg)  12/07/23 170 lb (77.1 kg)      Physical Exam Vitals and nursing note reviewed.  Constitutional:      Appearance: Normal appearance.  HENT:     Right Ear: Tympanic membrane, ear canal and external ear normal.     Left Ear: Tympanic membrane, ear canal and external ear normal.     Nose: Rhinorrhea present.     Mouth/Throat:     Pharynx: Oropharynx is clear.  Eyes:     Conjunctiva/sclera: Conjunctivae normal.  Cardiovascular:     Rate and Rhythm: Normal rate and regular rhythm.     Pulses: Normal pulses.     Heart sounds: Normal heart sounds.  Pulmonary:      Effort: Pulmonary effort is normal.     Breath sounds: Normal breath sounds.  Neurological:     Mental Status: He is alert and oriented to person, place, and time.  Psychiatric:        Mood and Affect: Mood normal.        Behavior: Behavior normal.        Thought Content: Thought content normal.        Judgment: Judgment normal.      No results found for any visits on 08/12/24.     The ASCVD Risk score (Arnett DK,  et al., 2019) failed to calculate for the following reasons:   Cannot find a previous HDL lab   Cannot find a previous total cholesterol lab    Assessment & Plan:  Viral URI    Assessment and Plan Assessment & Plan Acute viral upper respiratory infection with postnasal drip and post-viral cough Symptoms consistent with post-viral cough and postnasal drip. Lungs clear, no wheezing or shortness of breath. COVID-19 negative. No current evidence of pneumonia. - Complete current course of Augmentin . - Use benzonatate  as needed for cough. - Increase fluid intake, rest and use Coolmist humidifier while sleeping. - Monitor for new or worsening symptoms and contact provider if necessary.   Return if symptoms worsen or fail to improve.    Carrol Aurora, NP

## 2024-08-12 NOTE — Patient Instructions (Addendum)
  VISIT SUMMARY: You visited us  today due to ongoing respiratory symptoms following a viral illness that began about two and a half weeks ago. Initially, you had a sore throat, vomiting, diarrhea, fever, and chills, but most of these symptoms have resolved. You are still experiencing occasional coughing and a runny nose.  YOUR PLAN: -ACUTE VIRAL UPPER RESPIRATORY INFECTION WITH POSTNASAL DRIP AND POST-VIRAL COUGH: This means you had a viral infection that affected your upper respiratory system, and now you are experiencing a cough and nasal drip as your body continues to recover. Please complete your current course of Augmentin . Use benzonatate  as needed for your cough, increase your fluid intake, and use a Coolmist humidifier while sleeping. Monitor for any new or worsening symptoms and contact us  if necessary. Post-viral coughs can persist for weeks, so please be patient as you recover.  INSTRUCTIONS: Please complete your current course of Augmentin  and use benzonatate  as needed for your cough. Increase your fluid intake and use a Coolmist humidifier while sleeping. Monitor for any new or worsening symptoms and contact us  if necessary.  You can try a few things over the counter to help with your symptoms including:  Cough: Delsym or Robitussin (get the off brand, works just as well) Chest Congestion: Mucinex  (plain) Nasal Congestion/Ear Pressure/Sinus Pressure: Try using Flonase  (fluticasone ) nasal spray. Instill 1 spray in each nostril twice daily. This can be purchased over the counter. Body aches, fevers, headache: Ibuprofen (not to exceed 2400 mg in 24 hours) or Acetaminophen -Tylenol  (not to exceed 3000 mg in 24 hours) Runny Nose/Throat Drainage/Sneezing/Itchy or Watery Eyes: An antihistamine such as Zyrtec , Claritin, Xyzal, Allegra.   Follow up if symptoms worsen or do not improve.  It was a pleasure meeting you!

## 2024-08-28 ENCOUNTER — Ambulatory Visit: Admitting: Nurse Practitioner

## 2024-09-04 ENCOUNTER — Other Ambulatory Visit: Payer: Self-pay | Admitting: Nurse Practitioner

## 2024-09-10 ENCOUNTER — Ambulatory Visit: Admitting: Nurse Practitioner

## 2024-09-10 ENCOUNTER — Encounter: Payer: Self-pay | Admitting: Nurse Practitioner

## 2024-09-10 VITALS — BP 138/70 | HR 75 | Temp 98.3°F | Ht 72.0 in | Wt 165.0 lb

## 2024-09-10 DIAGNOSIS — Z131 Encounter for screening for diabetes mellitus: Secondary | ICD-10-CM | POA: Diagnosis not present

## 2024-09-10 DIAGNOSIS — Z13220 Encounter for screening for lipoid disorders: Secondary | ICD-10-CM

## 2024-09-10 DIAGNOSIS — J310 Chronic rhinitis: Secondary | ICD-10-CM | POA: Diagnosis not present

## 2024-09-10 DIAGNOSIS — M6208 Separation of muscle (nontraumatic), other site: Secondary | ICD-10-CM | POA: Diagnosis not present

## 2024-09-10 DIAGNOSIS — Z0000 Encounter for general adult medical examination without abnormal findings: Secondary | ICD-10-CM

## 2024-09-10 DIAGNOSIS — Z125 Encounter for screening for malignant neoplasm of prostate: Secondary | ICD-10-CM

## 2024-09-10 DIAGNOSIS — Z23 Encounter for immunization: Secondary | ICD-10-CM | POA: Diagnosis not present

## 2024-09-10 LAB — COMPREHENSIVE METABOLIC PANEL WITH GFR
ALT: 12 U/L (ref 0–53)
AST: 19 U/L (ref 0–37)
Albumin: 4.8 g/dL (ref 3.5–5.2)
Alkaline Phosphatase: 48 U/L (ref 39–117)
BUN: 12 mg/dL (ref 6–23)
CO2: 30 meq/L (ref 19–32)
Calcium: 9.8 mg/dL (ref 8.4–10.5)
Chloride: 105 meq/L (ref 96–112)
Creatinine, Ser: 1.24 mg/dL (ref 0.40–1.50)
GFR: 69.01 mL/min (ref >60.00)
Glucose, Bld: 94 mg/dL (ref 70–99)
Potassium: 4.4 meq/L (ref 3.5–5.1)
Sodium: 143 meq/L (ref 135–145)
Total Bilirubin: 0.5 mg/dL (ref 0.2–1.2)
Total Protein: 7.5 g/dL (ref 6.0–8.3)

## 2024-09-10 LAB — CBC WITH DIFFERENTIAL/PLATELET
Basophils Absolute: 0 K/uL (ref 0.0–0.1)
Basophils Relative: 0.3 % (ref 0.0–3.0)
Eosinophils Absolute: 0 K/uL (ref 0.0–0.7)
Eosinophils Relative: 1 % (ref 0.0–5.0)
HCT: 42.6 % (ref 39.0–52.0)
Hemoglobin: 13.6 g/dL (ref 13.0–17.0)
Lymphocytes Relative: 40.9 % (ref 12.0–46.0)
Lymphs Abs: 1.4 K/uL (ref 0.7–4.0)
MCHC: 32 g/dL (ref 30.0–36.0)
MCV: 83.4 fl (ref 78.0–100.0)
Monocytes Absolute: 0.3 K/uL (ref 0.1–1.0)
Monocytes Relative: 8.6 % (ref 3.0–12.0)
Neutro Abs: 1.7 K/uL (ref 1.4–7.7)
Neutrophils Relative %: 49.2 % (ref 43.0–77.0)
Platelets: 211 K/uL (ref 150.0–400.0)
RBC: 5.11 Mil/uL (ref 4.22–5.81)
RDW: 15.7 % — ABNORMAL HIGH (ref 11.5–15.5)
WBC: 3.4 K/uL — ABNORMAL LOW (ref 4.0–10.5)

## 2024-09-10 LAB — LIPID PANEL
Cholesterol: 178 mg/dL (ref 0–200)
HDL: 43.1 mg/dL (ref >39.00)
LDL Cholesterol: 123 mg/dL — ABNORMAL HIGH (ref 0–99)
NonHDL: 135.01
Total CHOL/HDL Ratio: 4
Triglycerides: 61 mg/dL (ref 0.0–149.0)
VLDL: 12.2 mg/dL (ref 0.0–40.0)

## 2024-09-10 LAB — HEMOGLOBIN A1C: Hgb A1c MFr Bld: 6.1 % (ref 4.6–6.5)

## 2024-09-10 LAB — PSA: PSA: 0.71 ng/mL (ref 0.10–4.00)

## 2024-09-10 LAB — TSH: TSH: 0.9 u[IU]/mL (ref 0.35–5.50)

## 2024-09-10 NOTE — Patient Instructions (Signed)
 Nice to see you today  I will be in touch with the labs once I have them  Follow up with me in 1 year, sooner if you need me  We updated your tetanus and flu vaccine today

## 2024-09-10 NOTE — Assessment & Plan Note (Signed)
 Improving with core strengthening exercises.  Continue

## 2024-09-10 NOTE — Assessment & Plan Note (Signed)
 Discussed age-appropriate immunizations and screenings.  Did review patient's personal, surgical, social, family histories.  Patient up-to-date on all age-appropriate vaccinations would like.  Update tetanus and flu vaccine today.  Patient is up-to-date on CRC screening.  Patient was given information at discharge about preventative healthcare maintenance with anticipatory guidance.  Did order PSA today for prostate cancer screening.

## 2024-09-10 NOTE — Progress Notes (Signed)
 Established Patient Office Visit  Subjective   Patient ID: Willie Farmer., male    DOB: 1976-08-13  Age: 48 y.o. MRN: [id number]  Chief Complaint  Patient presents with   Annual Exam    Flu vaccine     HPI  for complete physical and follow up of chronic conditions.  Immunizations: -Tetanus: Updated today -Influenza: update today -Shingles: Too young -Pneumonia: Too young  Diet: Fair diet. He is eating 2 meals a day and no snacks. He is doing water and some soda Exercise: No regular exercise.runs Monday-thurs, 75 push up and 300 crunches.   Eye exam: Completes annually. Wears glasses Dental exam: Completes semi-annually    Colonoscopy: Completed in 12/07/2023, repeat 10 years.  Patient will be due 2035 Lung Cancer Screening: N/A  PSA: Due  Sleep: going to bed around 11 and will get up around 8. Does not feel rested. States that his wife wakes him up around 6.       Review of Systems  Constitutional:  Negative for chills and fever.  Respiratory:  Negative for shortness of breath.   Cardiovascular:  Negative for chest pain and leg swelling.  Gastrointestinal:  Negative for abdominal pain, blood in stool, constipation, diarrhea, nausea and vomiting.  Genitourinary:  Negative for dysuria and hematuria.  Neurological:  Negative for tingling and headaches.  Psychiatric/Behavioral:  Negative for hallucinations and suicidal ideas.       Objective:     BP 138/70   Pulse 75   Temp 98.3 F (36.8 C) (Oral)   Ht 6' (1.829 m)   Wt 165 lb (74.8 kg)   SpO2 99%   BMI 22.38 kg/m  BP Readings from Last 3 Encounters:  09/10/24 138/70  08/12/24 112/64  08/06/24 128/86   Wt Readings from Last 3 Encounters:  09/10/24 165 lb (74.8 kg)  08/12/24 168 lb (76.2 kg)  03/11/24 169 lb 3.2 oz (76.7 kg)   SpO2 Readings from Last 3 Encounters:  09/10/24 99%  08/12/24 97%  08/06/24 98%      Physical Exam Vitals and nursing note reviewed.  Constitutional:       Appearance: Normal appearance.  HENT:     Right Ear: Tympanic membrane, ear canal and external ear normal.     Left Ear: Tympanic membrane, ear canal and external ear normal.     Mouth/Throat:     Mouth: Mucous membranes are moist.     Pharynx: Oropharynx is clear.  Eyes:     Extraocular Movements: Extraocular movements intact.     Pupils: Pupils are equal, round, and reactive to light.  Cardiovascular:     Rate and Rhythm: Normal rate and regular rhythm.     Pulses: Normal pulses.     Heart sounds: Normal heart sounds.  Pulmonary:     Effort: Pulmonary effort is normal.     Breath sounds: Normal breath sounds.  Abdominal:     General: Bowel sounds are normal. There is no distension.     Palpations: There is no mass.     Tenderness: There is no abdominal tenderness.     Hernia: No hernia is present.  Musculoskeletal:     Right lower leg: No edema.     Left lower leg: No edema.  Lymphadenopathy:     Cervical: No cervical adenopathy.  Skin:    General: Skin is warm.  Neurological:     General: No focal deficit present.     Mental Status: He is alert.  Deep Tendon Reflexes:     Reflex Scores:      Bicep reflexes are 2+ on the right side and 2+ on the left side.      Patellar reflexes are 2+ on the right side and 2+ on the left side.    Comments: Bilateral upper and lower extremity strength 5/5  Psychiatric:        Mood and Affect: Mood normal.        Behavior: Behavior normal.        Thought Content: Thought content normal.        Judgment: Judgment normal.      No results found for any visits on 09/10/24.    The ASCVD Risk score (Arnett DK, et al., 2019) failed to calculate for the following reasons:   Cannot find a previous HDL lab   Cannot find a previous total cholesterol lab    Assessment & Plan:   Problem List Items Addressed This Visit       Respiratory   Chronic rhinitis   Patient currently using ipratropium nasal spray as needed.  Continue         Musculoskeletal and Integument   Rectus diastasis   Improving with core strengthening exercises.  Continue        Other   Preventative health care - Primary   Discussed age-appropriate immunizations and screenings.  Did review patient's personal, surgical, social, family histories.  Patient up-to-date on all age-appropriate vaccinations would like.  Update tetanus and flu vaccine today.  Patient is up-to-date on CRC screening.  Patient was given information at discharge about preventative healthcare maintenance with anticipatory guidance.  Did order PSA today for prostate cancer screening.      Relevant Orders   Comprehensive metabolic panel with GFR   CBC with Differential/Platelet   TSH   Other Visit Diagnoses       Need for Tdap vaccination       Relevant Orders   Tdap vaccine greater than or equal to 7yo IM (Completed)     Screening for diabetes mellitus       Relevant Orders   Hemoglobin A1c     Screening for lipid disorders       Relevant Orders   Lipid panel     Screening for prostate cancer       Relevant Orders   PSA     Need for influenza vaccination       Relevant Orders   Flu vaccine trivalent PF, 6mos and older(Flulaval,Afluria,Fluarix,Fluzone) (Completed)       Return in about 1 year (around 09/10/2025) for CPE and Labs.    Adina Crandall, NP

## 2024-09-10 NOTE — Assessment & Plan Note (Signed)
 Patient currently using ipratropium nasal spray as needed.  Continue

## 2024-09-12 ENCOUNTER — Ambulatory Visit: Payer: Self-pay | Admitting: Nurse Practitioner

## 2025-09-11 ENCOUNTER — Encounter: Admitting: Nurse Practitioner
